# Patient Record
Sex: Female | Born: 1940
Health system: Southern US, Community
[De-identification: ages and names within clinical notes are randomized; demographics above are authoritative.]

## PROBLEM LIST (undated history)

## (undated) DIAGNOSIS — K644 Residual hemorrhoidal skin tags: Secondary | ICD-10-CM

## (undated) DIAGNOSIS — M8580 Other specified disorders of bone density and structure, unspecified site: Secondary | ICD-10-CM

## (undated) DIAGNOSIS — G250 Essential tremor: Secondary | ICD-10-CM

## (undated) DIAGNOSIS — H8390 Unspecified disease of inner ear, unspecified ear: Secondary | ICD-10-CM

## (undated) DIAGNOSIS — I872 Venous insufficiency (chronic) (peripheral): Secondary | ICD-10-CM

## (undated) DIAGNOSIS — R251 Tremor, unspecified: Secondary | ICD-10-CM

## (undated) DIAGNOSIS — E7800 Pure hypercholesterolemia, unspecified: Secondary | ICD-10-CM

## (undated) DIAGNOSIS — K648 Other hemorrhoids: Secondary | ICD-10-CM

## (undated) DIAGNOSIS — I7300 Raynaud's syndrome without gangrene: Secondary | ICD-10-CM

## (undated) HISTORY — PX: BREAST BIOPSY: SHX20

## (undated) HISTORY — DX: Tremor, unspecified: R25.1

## (undated) HISTORY — PX: TONSILLECTOMY: SUR1361

## (undated) HISTORY — DX: Residual hemorrhoidal skin tags: K64.4

## (undated) HISTORY — DX: Pure hypercholesterolemia, unspecified: E78.00

## (undated) HISTORY — PX: ABLATION SAPHENOUS VEIN W/ RFA: SUR11

## (undated) HISTORY — DX: Other hemorrhoids: K64.8

## (undated) HISTORY — DX: Essential tremor: G25.0

## (undated) HISTORY — DX: Other specified disorders of bone density and structure, unspecified site: M85.80

## (undated) HISTORY — PX: OTHER SURGICAL HISTORY: SHX169

---

## 1981-10-05 HISTORY — PX: OTHER SURGICAL HISTORY: SHX169

## 1989-10-05 HISTORY — PX: BREAST EXCISIONAL BIOPSY: SUR124

## 1998-08-01 ENCOUNTER — Ambulatory Visit (HOSPITAL_BASED_OUTPATIENT_CLINIC_OR_DEPARTMENT_OTHER): Admission: RE | Admit: 1998-08-01 | Discharge: 1998-08-01 | Payer: Self-pay

## 1999-05-22 ENCOUNTER — Encounter: Admission: RE | Admit: 1999-05-22 | Discharge: 1999-06-04 | Payer: Self-pay | Admitting: Orthopedic Surgery

## 2000-04-29 ENCOUNTER — Encounter: Payer: Self-pay | Admitting: Gynecology

## 2000-04-29 ENCOUNTER — Encounter: Admission: RE | Admit: 2000-04-29 | Discharge: 2000-04-29 | Payer: Self-pay | Admitting: Gynecology

## 2000-12-16 ENCOUNTER — Other Ambulatory Visit: Admission: RE | Admit: 2000-12-16 | Discharge: 2000-12-16 | Payer: Self-pay | Admitting: Gynecology

## 2001-10-19 ENCOUNTER — Ambulatory Visit (HOSPITAL_COMMUNITY): Admission: RE | Admit: 2001-10-19 | Discharge: 2001-10-19 | Payer: Self-pay | Admitting: Endocrinology

## 2001-10-19 ENCOUNTER — Encounter: Payer: Self-pay | Admitting: Endocrinology

## 2002-02-08 ENCOUNTER — Other Ambulatory Visit: Admission: RE | Admit: 2002-02-08 | Discharge: 2002-02-08 | Payer: Self-pay | Admitting: Gynecology

## 2003-03-07 ENCOUNTER — Other Ambulatory Visit: Admission: RE | Admit: 2003-03-07 | Discharge: 2003-03-07 | Payer: Self-pay | Admitting: Gynecology

## 2004-03-12 ENCOUNTER — Other Ambulatory Visit: Admission: RE | Admit: 2004-03-12 | Discharge: 2004-03-12 | Payer: Self-pay | Admitting: Gynecology

## 2004-03-17 ENCOUNTER — Encounter: Admission: RE | Admit: 2004-03-17 | Discharge: 2004-03-17 | Payer: Self-pay | Admitting: Gynecology

## 2006-02-16 ENCOUNTER — Encounter: Admission: RE | Admit: 2006-02-16 | Discharge: 2006-02-16 | Payer: Self-pay | Admitting: Gynecology

## 2006-02-23 ENCOUNTER — Other Ambulatory Visit: Admission: RE | Admit: 2006-02-23 | Discharge: 2006-02-23 | Payer: Self-pay | Admitting: Gynecology

## 2008-02-10 ENCOUNTER — Encounter: Payer: Self-pay | Admitting: Gastroenterology

## 2008-02-10 ENCOUNTER — Ambulatory Visit: Payer: Self-pay | Admitting: Gastroenterology

## 2008-02-10 DIAGNOSIS — E785 Hyperlipidemia, unspecified: Secondary | ICD-10-CM | POA: Insufficient documentation

## 2008-02-10 DIAGNOSIS — M129 Arthropathy, unspecified: Secondary | ICD-10-CM | POA: Insufficient documentation

## 2008-05-14 ENCOUNTER — Encounter: Admission: RE | Admit: 2008-05-14 | Discharge: 2008-05-14 | Payer: Self-pay | Admitting: Gynecology

## 2008-05-18 ENCOUNTER — Encounter: Admission: RE | Admit: 2008-05-18 | Discharge: 2008-05-18 | Payer: Self-pay | Admitting: Gynecology

## 2008-08-10 ENCOUNTER — Emergency Department (HOSPITAL_COMMUNITY): Admission: EM | Admit: 2008-08-10 | Discharge: 2008-08-10 | Payer: Self-pay | Admitting: Emergency Medicine

## 2011-06-05 ENCOUNTER — Encounter: Payer: Self-pay | Admitting: Gastroenterology

## 2012-03-08 DIAGNOSIS — I7300 Raynaud's syndrome without gangrene: Secondary | ICD-10-CM | POA: Diagnosis not present

## 2012-03-08 DIAGNOSIS — M519 Unspecified thoracic, thoracolumbar and lumbosacral intervertebral disc disorder: Secondary | ICD-10-CM | POA: Diagnosis not present

## 2012-03-08 DIAGNOSIS — G250 Essential tremor: Secondary | ICD-10-CM | POA: Diagnosis not present

## 2012-03-08 DIAGNOSIS — G252 Other specified forms of tremor: Secondary | ICD-10-CM | POA: Diagnosis not present

## 2012-03-08 DIAGNOSIS — E7800 Pure hypercholesterolemia, unspecified: Secondary | ICD-10-CM | POA: Diagnosis not present

## 2012-04-11 DIAGNOSIS — Z0000 Encounter for general adult medical examination without abnormal findings: Secondary | ICD-10-CM | POA: Diagnosis not present

## 2012-04-11 DIAGNOSIS — Z23 Encounter for immunization: Secondary | ICD-10-CM | POA: Diagnosis not present

## 2012-04-11 DIAGNOSIS — M949 Disorder of cartilage, unspecified: Secondary | ICD-10-CM | POA: Diagnosis not present

## 2012-04-11 DIAGNOSIS — M899 Disorder of bone, unspecified: Secondary | ICD-10-CM | POA: Diagnosis not present

## 2012-04-11 DIAGNOSIS — E7800 Pure hypercholesterolemia, unspecified: Secondary | ICD-10-CM | POA: Diagnosis not present

## 2012-04-11 DIAGNOSIS — Z131 Encounter for screening for diabetes mellitus: Secondary | ICD-10-CM | POA: Diagnosis not present

## 2012-04-25 DIAGNOSIS — Z1231 Encounter for screening mammogram for malignant neoplasm of breast: Secondary | ICD-10-CM | POA: Diagnosis not present

## 2012-04-27 DIAGNOSIS — E7800 Pure hypercholesterolemia, unspecified: Secondary | ICD-10-CM | POA: Diagnosis not present

## 2012-04-27 DIAGNOSIS — Z01419 Encounter for gynecological examination (general) (routine) without abnormal findings: Secondary | ICD-10-CM | POA: Diagnosis not present

## 2012-04-27 DIAGNOSIS — M949 Disorder of cartilage, unspecified: Secondary | ICD-10-CM | POA: Diagnosis not present

## 2012-04-27 DIAGNOSIS — R350 Frequency of micturition: Secondary | ICD-10-CM | POA: Diagnosis not present

## 2012-04-27 DIAGNOSIS — M899 Disorder of bone, unspecified: Secondary | ICD-10-CM | POA: Diagnosis not present

## 2012-08-22 DIAGNOSIS — H43819 Vitreous degeneration, unspecified eye: Secondary | ICD-10-CM | POA: Diagnosis not present

## 2013-06-07 DIAGNOSIS — G250 Essential tremor: Secondary | ICD-10-CM | POA: Diagnosis not present

## 2013-06-07 DIAGNOSIS — R259 Unspecified abnormal involuntary movements: Secondary | ICD-10-CM | POA: Diagnosis not present

## 2013-06-07 DIAGNOSIS — E7800 Pure hypercholesterolemia, unspecified: Secondary | ICD-10-CM | POA: Diagnosis not present

## 2013-06-07 DIAGNOSIS — Z0000 Encounter for general adult medical examination without abnormal findings: Secondary | ICD-10-CM | POA: Diagnosis not present

## 2013-06-07 DIAGNOSIS — Z1331 Encounter for screening for depression: Secondary | ICD-10-CM | POA: Diagnosis not present

## 2013-06-07 DIAGNOSIS — M899 Disorder of bone, unspecified: Secondary | ICD-10-CM | POA: Diagnosis not present

## 2013-06-07 DIAGNOSIS — R42 Dizziness and giddiness: Secondary | ICD-10-CM | POA: Diagnosis not present

## 2013-10-20 DIAGNOSIS — R05 Cough: Secondary | ICD-10-CM | POA: Diagnosis not present

## 2013-10-20 DIAGNOSIS — R059 Cough, unspecified: Secondary | ICD-10-CM | POA: Diagnosis not present

## 2013-10-25 DIAGNOSIS — H6980 Other specified disorders of Eustachian tube, unspecified ear: Secondary | ICD-10-CM | POA: Diagnosis not present

## 2013-10-25 DIAGNOSIS — H6690 Otitis media, unspecified, unspecified ear: Secondary | ICD-10-CM | POA: Diagnosis not present

## 2013-11-03 DIAGNOSIS — H6120 Impacted cerumen, unspecified ear: Secondary | ICD-10-CM | POA: Diagnosis not present

## 2013-11-03 DIAGNOSIS — H6980 Other specified disorders of Eustachian tube, unspecified ear: Secondary | ICD-10-CM | POA: Diagnosis not present

## 2013-11-07 DIAGNOSIS — H9209 Otalgia, unspecified ear: Secondary | ICD-10-CM | POA: Diagnosis not present

## 2013-11-07 DIAGNOSIS — R070 Pain in throat: Secondary | ICD-10-CM | POA: Diagnosis not present

## 2013-12-04 DIAGNOSIS — J069 Acute upper respiratory infection, unspecified: Secondary | ICD-10-CM | POA: Diagnosis not present

## 2013-12-04 DIAGNOSIS — J029 Acute pharyngitis, unspecified: Secondary | ICD-10-CM | POA: Diagnosis not present

## 2013-12-12 DIAGNOSIS — J40 Bronchitis, not specified as acute or chronic: Secondary | ICD-10-CM | POA: Diagnosis not present

## 2013-12-29 DIAGNOSIS — M25476 Effusion, unspecified foot: Secondary | ICD-10-CM | POA: Diagnosis not present

## 2013-12-29 DIAGNOSIS — M25473 Effusion, unspecified ankle: Secondary | ICD-10-CM | POA: Diagnosis not present

## 2013-12-29 DIAGNOSIS — R05 Cough: Secondary | ICD-10-CM | POA: Diagnosis not present

## 2013-12-29 DIAGNOSIS — R059 Cough, unspecified: Secondary | ICD-10-CM | POA: Diagnosis not present

## 2014-03-19 DIAGNOSIS — L259 Unspecified contact dermatitis, unspecified cause: Secondary | ICD-10-CM | POA: Diagnosis not present

## 2014-03-25 DIAGNOSIS — L255 Unspecified contact dermatitis due to plants, except food: Secondary | ICD-10-CM | POA: Diagnosis not present

## 2014-03-25 DIAGNOSIS — T6391XA Toxic effect of contact with unspecified venomous animal, accidental (unintentional), initial encounter: Secondary | ICD-10-CM | POA: Diagnosis not present

## 2014-03-26 DIAGNOSIS — L259 Unspecified contact dermatitis, unspecified cause: Secondary | ICD-10-CM | POA: Diagnosis not present

## 2014-09-12 DIAGNOSIS — B0089 Other herpesviral infection: Secondary | ICD-10-CM | POA: Diagnosis not present

## 2014-11-14 DIAGNOSIS — Z0000 Encounter for general adult medical examination without abnormal findings: Secondary | ICD-10-CM | POA: Diagnosis not present

## 2014-11-14 DIAGNOSIS — E780 Pure hypercholesterolemia: Secondary | ICD-10-CM | POA: Diagnosis not present

## 2014-11-14 DIAGNOSIS — R079 Chest pain, unspecified: Secondary | ICD-10-CM | POA: Diagnosis not present

## 2014-11-14 DIAGNOSIS — Z23 Encounter for immunization: Secondary | ICD-10-CM | POA: Diagnosis not present

## 2014-11-14 DIAGNOSIS — Z1211 Encounter for screening for malignant neoplasm of colon: Secondary | ICD-10-CM | POA: Diagnosis not present

## 2014-11-14 DIAGNOSIS — M8580 Other specified disorders of bone density and structure, unspecified site: Secondary | ICD-10-CM | POA: Diagnosis not present

## 2014-12-10 ENCOUNTER — Encounter: Payer: Self-pay | Admitting: *Deleted

## 2014-12-12 ENCOUNTER — Ambulatory Visit: Payer: Self-pay | Admitting: Cardiology

## 2015-01-03 DIAGNOSIS — M8580 Other specified disorders of bone density and structure, unspecified site: Secondary | ICD-10-CM | POA: Diagnosis not present

## 2015-01-03 DIAGNOSIS — M899 Disorder of bone, unspecified: Secondary | ICD-10-CM | POA: Diagnosis not present

## 2015-01-21 ENCOUNTER — Encounter: Payer: Self-pay | Admitting: *Deleted

## 2015-01-23 ENCOUNTER — Ambulatory Visit (INDEPENDENT_AMBULATORY_CARE_PROVIDER_SITE_OTHER): Payer: Medicare Other | Admitting: Cardiology

## 2015-01-23 ENCOUNTER — Encounter: Payer: Self-pay | Admitting: Cardiology

## 2015-01-23 VITALS — BP 110/66 | HR 59 | Ht 65.0 in | Wt 165.1 lb

## 2015-01-23 DIAGNOSIS — R079 Chest pain, unspecified: Secondary | ICD-10-CM | POA: Diagnosis not present

## 2015-01-23 DIAGNOSIS — R0600 Dyspnea, unspecified: Secondary | ICD-10-CM

## 2015-01-23 DIAGNOSIS — R0609 Other forms of dyspnea: Secondary | ICD-10-CM

## 2015-01-23 DIAGNOSIS — M129 Arthropathy, unspecified: Secondary | ICD-10-CM

## 2015-01-23 NOTE — Progress Notes (Signed)
Cardiology Office Note   Date:  01/23/2015   ID:  ABERDEEN HAFEN, DOB 13-Mar-1941, MRN [id number]  PCP:  Gerrit Heck, MD  Cardiologist:   Darlin Coco, MD   No chief complaint on file.     History of Present Illness: Crystal Thomas is a 74 y.o. female who presents for evaluation of exertional dyspnea and chest discomfort.  She is seen at the request of Dr. Leighton Ruff.  She does not have any prior history of known heart problems.  The past several months she has been experiencing some intermittent episodes of exertional dyspnea.  She had not been getting much regular exercise because of family health issues with other family members.  She did finally get back to the gym she found that she was having difficulty using the elliptical machine.  She would use it for just a few minutes and be totally exhausted and dyspneic.  She was able to do the treadmill.  Also been experiencing some occasional chest tightness which does not necessarily correlate with the episodes of exertional dyspnea.  The chest discomfort is substernal but does not radiate to the arm or neck or back.  The patient has not had a previous ischemic workup.  She does have a history of marked hypercholesterolemia.  Years ago she was placed on Lipitor and found that it seemed to be causing mental lassitude and confusion and so she stopped it.  Since then she has not been known to take any cholesterol medication.  Most recent cholesterol was 280 with LDL 199.  Patient does not have any history of high blood pressure or diabetes.  She does not have any history of premature coronary disease in the family.  Her father died at age 42 in [place].  Her mother died at age 34 of old age. The patient has been on propranolol 20 mg twice a day for about 30 years.  She was placed on it by Dr. Electa Sniff for treatment of a head bobbing tremor.  It has been effective. The patient has been generally healthy all of her  life.  Previous surgery includes a hysterectomy.  The patient had 4 children.  One child died of leukemia as an infant.  She has 3 grandchildren.  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  Does not smoke or drink    Past Medical History  Diagnosis Date  . Hypercholesteremia   . Osteopenia     Past Surgical History  Procedure Laterality Date  . Hysterectomey    . Breast biopsy       Current Outpatient Prescriptions  Medication Sig Dispense Refill  . aspirin 81 MG tablet Take 81 mg by mouth daily.    . propranolol (INDERAL) 20 MG tablet Take 20 mg by mouth 3 (three) times daily. Take 1-2 tablets in the morning and one at night.    . valACYclovir (VALTREX) 1000 MG tablet Take 1,000 mg by mouth as needed.     No current facility-administered medications for this visit.    Allergies:   Lipitor and Sulfonamide derivatives    Social History:  The patient  reports that she has never smoked. She does not have any smokeless tobacco history on file. She reports that she does not drink alcohol or use illicit drugs.   Family History:  The patient's family history includes Atrial fibrillation in her mother; Heart failure in her mother; Hypertension in her brother.    ROS:  Please see the history of present illness.   Otherwise, review of systems are positive for none.   All other systems are reviewed and negative.    PHYSICAL EXAM: VS:  BP 110/66 mmHg  Pulse 59  Ht 5\' 5"  (1.651 m)  Wt 165 lb 1.9 oz (74.898 kg)  BMI 27.48 kg/m2 , BMI Body mass index is 27.48 kg/(m^2). GEN: Well nourished, well developed, in no acute distress HEENT: normal Neck: no JVD, carotid bruits, or masses Cardiac: RRR; no murmurs, rubs, or gallops,no edema  Respiratory:  clear to auscultation bilaterally, normal work of breathing GI: soft, nontender, nondistended, + BS MS: no deformity or atrophy Skin: warm and dry, no rash Neuro:  Strength and sensation are intact Psych: euthymic mood, full  affect   EKG:  EKG is ordered today. The ekg ordered today demonstrates sinus bradycardia.  Nonspecific ST abnormality.  No ischemic changes.   Recent Labs: No results found for requested labs within last 365 days.    Lipid Panel No results found for: CHOL, TRIG, HDL, CHOLHDL, VLDL, LDLCALC, LDLDIRECT    Wt Readings from Last 3 Encounters:  01/23/15 165 lb 1.9 oz (74.898 kg)  02/10/08 164 lb (74.39 kg)      Other studies Reviewed: Additional studies/ records that were reviewed today include: Laboratory studies from [facility] dated 11/15/14 Review of the above records demonstrates: Cholesterol 280 triglycerides 70 HDL 67 calculated LDL 199 non-HDL 213 blood sugar 89, creatinine 0.74, GFR estimated at 77   ASSESSMENT AND PLAN:  1.  Exertional dyspnea and chest discomfort uncertain etiology with moderate suspicion for coronary artery disease. 2.  Hypercholesterolemia 3.  Chronic head bobbing tremor with propranolol  Disposition: We will obtain a chest x-ray. We will have the patient return for a treadmill Myoview stress test.  She will hold Inderal that she takes for her head bobbing tremor for 24 hours prior to the test. Also suggested that she take a baby aspirin daily.     Labs/ tests ordered today include:   Orders Placed This Encounter  Procedures  . DG Chest 2 View  . Myocardial Perfusion Imaging  . EKG 12-Lead    Many thanks for the opportunity to see this pleasant woman with you.  We will be in touch regarding the results of her treadmill Myoview stress test   Signed, Darlin Coco, MD  01/23/2015 4:42 PM    [facility] [place], [place], [place]  [address] Phone: [phone]; Fax: [phone]

## 2015-01-23 NOTE — Patient Instructions (Signed)
Medication Instructions:  START ASPIRIN 81 MG DAILY   Labwork: NONE  Testing/Procedures: A chest x-ray takes a picture of the organs and structures inside the chest, including the heart, lungs, and blood vessels. This test can show several things, including, whether the heart is enlarges; whether fluid is building up in the lungs; and whether pacemaker / defibrillator leads are still in place.  [facility] BETWEEN 9 AM AND 4 PM MON-FRI  Your physician has requested that you have en exercise stress myoview. For further information please visit HugeFiesta.tn. Please follow instruction sheet, as given.  Follow-Up: AS NEEDED

## 2015-02-13 ENCOUNTER — Ambulatory Visit (HOSPITAL_COMMUNITY): Payer: Medicare Other | Attending: Cardiology

## 2015-02-13 ENCOUNTER — Other Ambulatory Visit (HOSPITAL_COMMUNITY): Payer: Medicare Other

## 2015-02-13 DIAGNOSIS — R0600 Dyspnea, unspecified: Secondary | ICD-10-CM | POA: Diagnosis not present

## 2015-02-13 DIAGNOSIS — R079 Chest pain, unspecified: Secondary | ICD-10-CM | POA: Insufficient documentation

## 2015-02-13 DIAGNOSIS — R0609 Other forms of dyspnea: Secondary | ICD-10-CM | POA: Diagnosis not present

## 2015-02-13 LAB — MYOCARDIAL PERFUSION IMAGING
CHL CUP NUCLEAR SSS: 4
CHL CUP STRESS STAGE 1 DBP: 74 mmHg
CHL CUP STRESS STAGE 1 GRADE: 0 %
CHL CUP STRESS STAGE 1 SBP: 124 mmHg
CHL CUP STRESS STAGE 1 SPEED: 0 mph
CHL CUP STRESS STAGE 2 DBP: 73 mmHg
CHL CUP STRESS STAGE 2 SBP: 110 mmHg
CHL CUP STRESS STAGE 4 HR: 97 {beats}/min
CHL CUP STRESS STAGE 4 SBP: 118 mmHg
CHL CUP STRESS STAGE 4 SPEED: 1.7 mph
CHL CUP STRESS STAGE 5 HR: 137 {beats}/min
CHL CUP STRESS STAGE 5 SBP: 154 mmHg
CHL CUP STRESS STAGE 7 DBP: 66 mmHg
CHL CUP STRESS STAGE 7 GRADE: 0 %
CHL CUP STRESS STAGE 7 HR: 116 {beats}/min
CHL CUP STRESS STAGE 7 SPEED: 0 mph
CHL CUP STRESS STAGE 8 DBP: 68 mmHg
CHL CUP STRESS STAGE 8 GRADE: 0 %
CHL CUP STRESS STAGE 8 HR: 75 {beats}/min
CSEPED: 6 min
CSEPEDS: 0 s
Estimated workload: 7 METS
LHR: 0.37
LVDIAVOL: 60 mL
LVSYSVOL: 16 mL
MPHR: 146 {beats}/min
NUC STRESS EF: 74 %
NUC STRESS TID: 0.79
Peak HR: 137 {beats}/min
Percent HR: 93 %
Percent of predicted max HR: 93 %
Rest HR: 56 {beats}/min
SDS: 1
SRS: 3
Stage 1 HR: 67 {beats}/min
Stage 2 Grade: 0 %
Stage 2 HR: 57 {beats}/min
Stage 2 Speed: 0 mph
Stage 3 Grade: 0 %
Stage 3 HR: 57 {beats}/min
Stage 3 Speed: 0 mph
Stage 4 DBP: 69 mmHg
Stage 4 Grade: 10 %
Stage 5 DBP: 72 mmHg
Stage 5 Grade: 12 %
Stage 5 Speed: 2.5 mph
Stage 6 Grade: 12 %
Stage 6 HR: 137 {beats}/min
Stage 6 Speed: 2.5 mph
Stage 7 SBP: 149 mmHg
Stage 8 SBP: 148 mmHg
Stage 8 Speed: 0 mph

## 2015-02-13 MED ORDER — TECHNETIUM TC 99M SESTAMIBI GENERIC - CARDIOLITE
11.0000 | Freq: Once | INTRAVENOUS | Status: AC | PRN
  Administered 2015-02-13: 11 via INTRAVENOUS

## 2015-02-13 MED ORDER — TECHNETIUM TC 99M SESTAMIBI GENERIC - CARDIOLITE
33.0000 | Freq: Once | INTRAVENOUS | Status: AC | PRN
Start: 2015-02-13 — End: 2015-02-13
  Administered 2015-02-13: 33 via INTRAVENOUS

## 2015-02-14 ENCOUNTER — Telehealth: Payer: Self-pay | Admitting: Cardiology

## 2015-02-14 DIAGNOSIS — R0600 Dyspnea, unspecified: Secondary | ICD-10-CM

## 2015-02-14 DIAGNOSIS — R0609 Other forms of dyspnea: Secondary | ICD-10-CM

## 2015-02-14 DIAGNOSIS — R0602 Shortness of breath: Secondary | ICD-10-CM

## 2015-02-14 NOTE — Telephone Encounter (Signed)
New message     Returning Melinda's call to get test results

## 2015-02-14 NOTE — Telephone Encounter (Signed)
Advised patient of results  Patient states that she did short of breath while walking on treadmill Does not get short of breath all the time but when does it is with exertion and to the point she needs to stop what she is doing  Discussed with  Dr. Mare Ferrari and will have patient return for Echo Advised patient, verbalized understanding

## 2015-02-14 NOTE — Telephone Encounter (Signed)
-----   Message from Darlin Coco, MD sent at 02/13/2015  3:38 PM EDT ----- Please report.  The Lexiscan Myoview stress test was normal.  No ischemia.  Normal left ventricular function.  Please send a copy to Dr. Drema Dallas

## 2015-02-22 ENCOUNTER — Other Ambulatory Visit: Payer: Self-pay

## 2015-02-22 ENCOUNTER — Ambulatory Visit (HOSPITAL_COMMUNITY): Payer: Medicare Other | Attending: Cardiology

## 2015-02-22 DIAGNOSIS — M1990 Unspecified osteoarthritis, unspecified site: Secondary | ICD-10-CM | POA: Insufficient documentation

## 2015-02-22 DIAGNOSIS — R0609 Other forms of dyspnea: Secondary | ICD-10-CM

## 2015-02-22 DIAGNOSIS — R0600 Dyspnea, unspecified: Secondary | ICD-10-CM

## 2015-02-22 DIAGNOSIS — E785 Hyperlipidemia, unspecified: Secondary | ICD-10-CM | POA: Insufficient documentation

## 2015-04-11 DIAGNOSIS — N952 Postmenopausal atrophic vaginitis: Secondary | ICD-10-CM | POA: Diagnosis not present

## 2015-04-11 DIAGNOSIS — N760 Acute vaginitis: Secondary | ICD-10-CM | POA: Diagnosis not present

## 2015-04-11 DIAGNOSIS — Z1239 Encounter for other screening for malignant neoplasm of breast: Secondary | ICD-10-CM | POA: Diagnosis not present

## 2015-05-24 DIAGNOSIS — M7989 Other specified soft tissue disorders: Secondary | ICD-10-CM | POA: Diagnosis not present

## 2015-05-24 DIAGNOSIS — N952 Postmenopausal atrophic vaginitis: Secondary | ICD-10-CM | POA: Diagnosis not present

## 2015-05-24 DIAGNOSIS — N760 Acute vaginitis: Secondary | ICD-10-CM | POA: Diagnosis not present

## 2015-05-24 DIAGNOSIS — N898 Other specified noninflammatory disorders of vagina: Secondary | ICD-10-CM | POA: Diagnosis not present

## 2015-07-24 DIAGNOSIS — N952 Postmenopausal atrophic vaginitis: Secondary | ICD-10-CM | POA: Diagnosis not present

## 2015-07-24 DIAGNOSIS — R32 Unspecified urinary incontinence: Secondary | ICD-10-CM | POA: Diagnosis not present

## 2015-07-24 DIAGNOSIS — N760 Acute vaginitis: Secondary | ICD-10-CM | POA: Diagnosis not present

## 2015-08-14 ENCOUNTER — Ambulatory Visit: Payer: Medicare Other | Admitting: Physical Therapy

## 2015-09-04 ENCOUNTER — Ambulatory Visit: Payer: Medicare Other | Attending: Obstetrics & Gynecology | Admitting: Physical Therapy

## 2015-09-04 ENCOUNTER — Encounter: Payer: Self-pay | Admitting: Physical Therapy

## 2015-09-04 DIAGNOSIS — N8189 Other female genital prolapse: Secondary | ICD-10-CM

## 2015-09-04 DIAGNOSIS — N907 Vulvar cyst: Secondary | ICD-10-CM | POA: Diagnosis not present

## 2015-09-04 NOTE — Patient Instructions (Signed)
Quick Contraction: Gravity Resisted (Sitting)    Sitting, quickly squeeze then fully relax pelvic floor. Perform __1_ sets of _5__. Rest for _1__ seconds between sets. Do _3__ times a day.  Copyright  VHI. All rights reserved.  Slow Contraction: Gravity Resisted (Sitting)    Sitting, slowly squeeze pelvic floor for _6__ seconds. Rest for _3__ seconds. Repeat _10__ times. Do _3__ times a day.  Copyright  VHI. All rights reserved.   Certain foods and liquids will decrease the pH making the urine more acidic.  Urinary urgency increases when the urine has a low pH.  Most common irritants: alcohol, carbonated beverages and caffinated beverages.  Foods to avoid: apple juice, apples, ascorbic acid, canteloupes, chili, citrus fruits, coffee, cranberries, grapes, guava, peaches, pepper, pineapple, plums, strawberries, tea, tomatoes, and vinegar.  Drinking plenty of water may help to increase the pH and dilute out any of the effects of specific irritants.  Foods that are NOT irritating to the bladder include: Pears, papayas, sun-brewed teas, watermelons, non-citrus herbal teas, apricots, kava and low-acid instant drinks (Postum)  [facility] [address] [place],  [address] Phone # [phone] Fax [phone]

## 2015-09-04 NOTE — Therapy (Signed)
[facility] [address], [place], [place], [address] Phone: [phone]   Fax:  [phone]  Physical Therapy Evaluation  Patient Details  Name: Crystal Thomas MRN: [id number] Date of Birth: [birth year] Referring Provider: Dr. Janyth Pupa  Encounter Date: 09/04/2015      PT End of Session - 09/04/15 1224    Visit Number 1   Number of Visits 10  Medicare   Date for PT Re-Evaluation 10/30/15   PT Start Time [id number]   PT Stop Time 1225   PT Time Calculation (min) 40 min   Activity Tolerance Patient tolerated treatment well   Behavior During Therapy [facility] for tasks assessed/performed      Past Medical History  Diagnosis Date  . Hypercholesteremia   . Osteopenia     Past Surgical History  Procedure Laterality Date  . Hysterectomey    . Breast biopsy      There were no vitals filed for this visit.  Visit Diagnosis:  Perineal floor weakness - Plan: PT plan of care cert/re-cert      Subjective Assessment - 09/04/15 1151    Subjective Patient reports a vaginal discharge and had it checked out. Patient has odor after discharge was cleared up and realized odor was urine. Trace of leakage on pantyliner.    Patient Stated Goals reduce leakage   Currently in Pain? No/denies            [facility] PT Assessment - 09/04/15 0001    Assessment   Medical Diagnosis R32 Urinary incontinence   Referring Provider Dr. Janyth Pupa   Onset Date/Surgical Date 06/06/15   Prior Therapy None   Precautions   Precautions None   Balance Screen   Has the patient fallen in the past 6 months No   Has the patient had a decrease in activity level because of a fear of falling?  No   Is the patient reluctant to leave their home because of a fear of falling?  No   Prior Function   Level of Independence Independent   Leisure takes care of husband   Cognition   Overall Cognitive Status Within Functional Limits for tasks  assessed   Observation/Other Assessments   Focus on Therapeutic Outcomes (FOTO)  43% limitaiton CK  goals is 30% limitation CK   ROM / Strength   AROM / PROM / Strength AROM;Strength   AROM   Lumbar Extension decreased by 50%   Lumbar - Right Side Bend decreased by 255   Lumbar - Left Side Bend decreased by 25%   Strength   Right Hip Extension 4/5   Right Hip ABduction 3+/5   Left Hip Extension 4/5   Left Hip ABduction 4/5                 Pelvic Floor Special Questions - 09/04/15 0001    Are you Pregnant or attempting pregnancy? No   Prior Pregnancies Yes   Number of Pregnancies 4   Number of Vaginal Deliveries 4   Episiotomy Performed Yes   Currently Sexually Active No   Urinary Leakage Yes   Pad use 2  light day   Activities that cause leaking With strong urge   Urinary urgency Yes   Fecal incontinence No   Caffeine beverages 3   Falling out feeling (prolapse) No   Pelvic Floor Internal Exam Patient confirms identification and approves physical therapist to assess muscle strength   Exam Type Vaginal   Strength  fair squeeze, definite lift   Strength # of seconds 6                  PT Education - 09-11-2015 1224    Education provided Yes   Education Details bladder irritants, pelvic floor exercises   Person(s) Educated Patient   Methods Explanation;Demonstration;Verbal cues;Handout   Comprehension Returned demonstration;Verbalized understanding          PT Short Term Goals - Sep 11, 2015 1317    PT SHORT TERM GOAL #1   Title understand what bladder irritants are and how they affect the bladder   Time 4   Period Weeks   Status New   PT SHORT TERM GOAL #2   Title independent with pelvic floor exercise HEP   Time 4   Period Weeks   Status New   PT SHORT TERM GOAL #3   Title independent with flexibility exercises   Time 4   Period Weeks   Status New   PT SHORT TERM GOAL #4   Title urinary leakage decresaed >/= 25%   Time 4   Period Weeks    Status New           PT Long Term Goals - 11-Sep-2015 1319    PT LONG TERM GOAL #1   Title independent with curent HEP and how to progress herself   Time 8   Period Weeks   Status New   PT LONG TERM GOAL #2   Title urinary leakage decreased >/= 75%   Time 8   Period Weeks   Status New   PT LONG TERM GOAL #3   Title does not have to wear a pad due to improved urinary leakage   Time 8   Period Weeks   Status New               Plan - September 11, 2015 1225    Clinical Impression Statement Patient is a 74 year old female with diagnosis of urinary incontinence since 06/2015.  Patient reports no pain.  FOTO score is 43% limitation.  Patient reports urinary leakage when she has a strong urge and wears 2 pads per day. Lumbar ROM deficits: extension decreased by 505 and bil. sidebending decreased by 25%. Hip strenth deficits right/left/5: abduction 3+/4/5 and extension 4/4/5. Pelvic floor strength is 3/ with circular contraction and slight lift. Patient will benefit from physical therapy to improve pelvic floor strength and reduce urinary incontinence.    Pt will benefit from skilled therapeutic intervention in order to improve on the following deficits Decreased activity tolerance;Decreased strength;Decreased endurance   Rehab Potential Excellent   Clinical Impairments Affecting Rehab Potential None   PT Frequency 1x / week   PT Duration 8 weeks   PT Treatment/Interventions ADLs/Self Care Home Management;Therapeutic exercise;Therapeutic activities;Neuromuscular re-education;Patient/family education   PT Next Visit Plan hip stretches for SKC, hip rotators, hamstring, hip strength for abduction and extension, and pelvic floor exercises   PT Home Exercise Plan hip flexibility and strength   Recommended Other Services None   Consulted and Agree with Plan of Care Patient          G-Codes - 2015-09-11 1145    Functional Assessment Tool Used FOTO score is 43% limitation CK  goal is 30%  limitation CJ   Functional Limitation Other PT primary   Other PT Primary Current Status [id number]) At least 40 percent but less than 60 percent impaired, limited or restricted   Other PT Primary Goal Status [id number]) At  least 20 percent but less than 40 percent impaired, limited or restricted       Problem List Patient Active Problem List   Diagnosis Date Noted  . Dyspnea on exertion 01/23/2015  . Chest pain with moderate risk for cardiac etiology 01/23/2015  . HYPERLIPIDEMIA 02/10/2008  . ARTHRITIS 02/10/2008    Crystal Thomas,PT 09/04/2015, 1:21 PM  [facility] [address] [place], [place], [address] Phone: [phone]   Fax:  [phone]  Name: Crystal Thomas MRN: [id number] Date of Birth: 01-27-1941

## 2015-09-25 ENCOUNTER — Encounter: Payer: Self-pay | Admitting: Physical Therapy

## 2015-09-25 ENCOUNTER — Ambulatory Visit: Payer: Medicare Other | Attending: Obstetrics & Gynecology | Admitting: Physical Therapy

## 2015-09-25 DIAGNOSIS — N907 Vulvar cyst: Secondary | ICD-10-CM | POA: Diagnosis not present

## 2015-09-25 DIAGNOSIS — N8189 Other female genital prolapse: Secondary | ICD-10-CM

## 2015-09-25 NOTE — Patient Instructions (Signed)
Chair Sitting    Sit at edge of seat, spine straight, one leg extended.Keep toe pointed to head.  Put a hand on each thigh and bend forward from the hip, keeping spine straight. Allow hand on extended leg to reach toward toes. Support upper body with other arm. Hold _30__ seconds. Repeat _2__ times per session. Do _1__ sessions per day.  Copyright  VHI. All rights reserved.  Piriformis Stretch, Sitting    Sit, one ankle on opposite knee, same-side hand on crossed knee. Push down on knee, keeping spine straight. Lean torso forward, with flat back, until tension is felt in hamstrings and gluteals of crossed-leg side. Hold _30__ seconds.  Repeat _2__ times per session. Do _1__ sessions per day.  Copyright  VHI. All rights reserved.  ABDUCTION: Standing (Active)    Stand, feet flat. Lift right leg out to side.  Complete __1_ sets of _10__ repetitions. Perform ___ sessions per day. When you can do 30x on each leg without and pain then add 1 pound http://gtsc.exer.us/110   Copyright  VHI. All rights reserved.  EXTENSION: Standing (Active)    Stand, both feet flat. Draw right leg behind body as far as possible. . Complete __1_ sets of _10__ repetitions. Perform __1_ sessions per day. When you can do 30 x without pain or difficulty add 1#aortic stenosis  http://gtsc.exer.us/76   Copyright  VHI. All rights reserved.  Knee Extension: Resisted (Sitting)    With band looped around right ankle and under other foot, straighten leg with ankle loop.Pull up pelvic floor as you straighten the leg and breath out. As Keep other leg bent to increase resistance. Repeat _30___ times per set. Do _1___ sets per session. Do _1___ sessions per day.  http://orth.exer.us/691   Copyright  VHI. All rights reserved.  Squat    Standing, squeeze pelvic floor and hold. Squat. Hold squat for 10 sec. Bend at hips. Relax. Repeat _10__ times. Do _1__ times a day.  Copyright  VHI. All rights  reserved.  Abdominal Bracing With Pelvic Floor (Hook-Lying)    With neutral spine, tighten pelvic floor as you breath out and abdominals. Hold 5 sec. Repeat _10__ times. Do _1__ times a day.   Copyright  VHI. All rights reserved.    Bracing With Knee Fallout (Hook-Lying)    With neutral spine, breath in as you  legs, drop knee out to side. Then bring knee in as you pull up the pelvic floor and breath out. Keep opposite hip still. Repeat _10__ times. Do _1__ times a day. Each leg.   Copyright  VHI. All rights reserved.  [facility] [address], [place], [place] [address] Phone # [phone] Fax [phone]

## 2015-09-25 NOTE — Therapy (Addendum)
[facility] [address] [place], [place], [address] Phone: [phone]   Fax:  [phone]  Physical Therapy Treatment  Patient Details  Name: Crystal Thomas MRN: [id number] Date of Birth: 18-Nov-1940 Referring Provider: Dr. Janyth Pupa  Encounter Date: 09/25/2015      PT End of Session - 09/25/15 1151    Visit Number 2   Number of Visits 10  medicare   Date for PT Re-Evaluation 10/30/15   PT Start Time 1100   PT Stop Time 1140   PT Time Calculation (min) 40 min   Activity Tolerance Patient tolerated treatment well   Behavior During Therapy [facility] for tasks assessed/performed      Past Medical History  Diagnosis Date  . Hypercholesteremia   . Osteopenia     Past Surgical History  Procedure Laterality Date  . Hysterectomey    . Breast biopsy      There were no vitals filed for this visit.  Visit Diagnosis:  Perineal floor weakness      Subjective Assessment - 09/25/15 1110    Subjective Patient reports her urinary leakage is 205 better. I have had a little sciatica the past week. I feel like I have tight hips.    Patient Stated Goals reduce leakage   Currently in Pain? Yes   Pain Score 4    Pain Location Back   Pain Orientation Right   Pain Descriptors / Indicators Tingling;Aching   Pain Type Acute pain   Pain Radiating Towards down right leg to right knee   Pain Onset In the past 7 days   Pain Frequency Intermittent   Aggravating Factors  twisting; sweeping, rake the yard   Pain Relieving Factors rest   Multiple Pain Sites No     G-code: Functional assessment tool used FOTO score is 43% Limitation ; functional limitation other PT primary, goal status is CJ, discharge status is CK.  Patient did not return. Earlie Counts, PT 12/04/2015 3:43 PM                      [facility] Adult PT Treatment/Exercise - 09/25/15 0001    Lumbar Exercises: Stretches   Active Hamstring  Stretch 30 seconds;2 reps  bil. sitting   Piriformis Stretch 2 reps;30 seconds  bil. sitting   Lumbar Exercises: Supine   Ab Set 10 reps;5 seconds   AB Set Limitations with pelvic floor contraction   Clam 10 reps  bil. with pelvic floor contraction   Knee/Hip Exercises: Standing   Hip Abduction Left;Right;10 reps  with control and no tipping of trunk   Hip Extension Left;Right;10 reps  vc to go slow and keep trunk still   Other Standing Knee Exercises squat with holding on counter hold 10 sec 5 with pelvic floor contraction   Other Standing Knee Exercises sitting knee extension with red band 30x bil.                 PT Education - 09/25/15 1147    Education provided Yes   Education Details pelvic floor strengthening, flexibility exercises, hip and knee strengthening   Person(s) Educated Patient   Methods Explanation;Demonstration;Verbal cues;Handout   Comprehension Returned demonstration;Verbalized understanding          PT Short Term Goals - 09/25/15 1152    PT SHORT TERM GOAL #1   Title understand what bladder irritants are and how they affect the bladder   Time 4   Period  Weeks   Status Achieved   PT SHORT TERM GOAL #2   Title independent with pelvic floor exercise HEP   Time 4   Period Weeks   Status Achieved   PT SHORT TERM GOAL #3   Title independent with flexibility exercises   Time 4   Period Weeks   Status On-going   PT SHORT TERM GOAL #4   Title urinary leakage decresaed >/= 25%   Time 4   Period Weeks   Status On-going  20% better           PT Long Term Goals - 09/04/15 1319    PT LONG TERM GOAL #1   Title independent with curent HEP and how to progress herself   Time 8   Period Weeks   Status New   PT LONG TERM GOAL #2   Title urinary leakage decreased >/= 75%   Time 8   Period Weeks   Status New   PT LONG TERM GOAL #3   Title does not have to wear a pad due to improved urinary leakage   Time 8   Period Weeks   Status New                Plan - 09/25/15 1152    Clinical Impression Statement Patient is a 74 year old female with diagnosis of urinary incontinenece.  Patient reports her urinary incontinence has improved by 20%.  Patient reports she has slipped her disc and causes sciatica down right leg.  Patient has weakness in legs and core.  Patient will benefit from physical therapy to increase knee, hip and pelvic floor strength.    Pt will benefit from skilled therapeutic intervention in order to improve on the following deficits Decreased activity tolerance;Decreased strength;Decreased endurance   Rehab Potential Excellent   Clinical Impairments Affecting Rehab Potential None   PT Frequency 1x / week   PT Duration 8 weeks   PT Treatment/Interventions ADLs/Self Care Home Management;Therapeutic exercise;Therapeutic activities;Neuromuscular re-education;Patient/family education   PT Next Visit Plan  pelvic floor exercises holding for longer time   PT Home Exercise Plan progress pelvic floor exercises   Consulted and Agree with Plan of Care Patient        Problem List Patient Active Problem List   Diagnosis Date Noted  . Dyspnea on exertion 01/23/2015  . Chest pain with moderate risk for cardiac etiology 01/23/2015  . HYPERLIPIDEMIA 02/10/2008  . ARTHRITIS 02/10/2008    Earlie Counts, PT 09/25/2015 11:57 AM   [facility] [address], [place], [place], [address] Phone: [phone]   Fax:  [phone]  Name: Crystal Thomas MRN: [id number] Date of Birth: 04-Jun-1941  PHYSICAL THERAPY DISCHARGE SUMMARY  Visits from Start of Care: 2  Current functional level related to goals / functional outcomes: See above. Patient did not return after her last visit on 09/24/2016.    Remaining deficits: See above.    Education / Equipment: HEP  Plan:                                                    Patient goals were not met.  Patient is being discharged due to not returning since the last visit. Thank you for the referral. Earlie Counts, PT 12/04/2015 3:46 PM   ?????

## 2015-10-15 ENCOUNTER — Ambulatory Visit: Payer: Medicare Other | Attending: Obstetrics & Gynecology | Admitting: Physical Therapy

## 2015-10-26 DIAGNOSIS — J069 Acute upper respiratory infection, unspecified: Secondary | ICD-10-CM | POA: Diagnosis not present

## 2015-11-27 DIAGNOSIS — M8588 Other specified disorders of bone density and structure, other site: Secondary | ICD-10-CM | POA: Diagnosis not present

## 2015-11-27 DIAGNOSIS — I7300 Raynaud's syndrome without gangrene: Secondary | ICD-10-CM | POA: Diagnosis not present

## 2015-11-27 DIAGNOSIS — G250 Essential tremor: Secondary | ICD-10-CM | POA: Diagnosis not present

## 2015-11-27 DIAGNOSIS — Z0000 Encounter for general adult medical examination without abnormal findings: Secondary | ICD-10-CM | POA: Diagnosis not present

## 2015-11-27 DIAGNOSIS — M519 Unspecified thoracic, thoracolumbar and lumbosacral intervertebral disc disorder: Secondary | ICD-10-CM | POA: Diagnosis not present

## 2015-11-27 DIAGNOSIS — M25551 Pain in right hip: Secondary | ICD-10-CM | POA: Diagnosis not present

## 2015-11-27 DIAGNOSIS — E7800 Pure hypercholesterolemia, unspecified: Secondary | ICD-10-CM | POA: Diagnosis not present

## 2015-11-27 DIAGNOSIS — Z1389 Encounter for screening for other disorder: Secondary | ICD-10-CM | POA: Diagnosis not present

## 2015-12-28 DIAGNOSIS — H5712 Ocular pain, left eye: Secondary | ICD-10-CM | POA: Diagnosis not present

## 2016-02-12 DIAGNOSIS — L821 Other seborrheic keratosis: Secondary | ICD-10-CM | POA: Diagnosis not present

## 2016-02-12 DIAGNOSIS — L820 Inflamed seborrheic keratosis: Secondary | ICD-10-CM | POA: Diagnosis not present

## 2016-04-22 ENCOUNTER — Encounter: Payer: Self-pay | Admitting: Gastroenterology

## 2016-11-18 ENCOUNTER — Encounter: Payer: Self-pay | Admitting: Gastroenterology

## 2016-11-23 ENCOUNTER — Encounter: Payer: Self-pay | Admitting: Physician Assistant

## 2016-11-30 ENCOUNTER — Encounter: Payer: Self-pay | Admitting: *Deleted

## 2016-12-01 DIAGNOSIS — I7300 Raynaud's syndrome without gangrene: Secondary | ICD-10-CM | POA: Diagnosis not present

## 2016-12-01 DIAGNOSIS — M519 Unspecified thoracic, thoracolumbar and lumbosacral intervertebral disc disorder: Secondary | ICD-10-CM | POA: Diagnosis not present

## 2016-12-01 DIAGNOSIS — Z0000 Encounter for general adult medical examination without abnormal findings: Secondary | ICD-10-CM | POA: Diagnosis not present

## 2016-12-01 DIAGNOSIS — G250 Essential tremor: Secondary | ICD-10-CM | POA: Diagnosis not present

## 2016-12-01 DIAGNOSIS — M8588 Other specified disorders of bone density and structure, other site: Secondary | ICD-10-CM | POA: Diagnosis not present

## 2016-12-01 DIAGNOSIS — E7800 Pure hypercholesterolemia, unspecified: Secondary | ICD-10-CM | POA: Diagnosis not present

## 2016-12-01 DIAGNOSIS — Z1389 Encounter for screening for other disorder: Secondary | ICD-10-CM | POA: Diagnosis not present

## 2016-12-02 ENCOUNTER — Ambulatory Visit (INDEPENDENT_AMBULATORY_CARE_PROVIDER_SITE_OTHER): Payer: Medicare Other | Admitting: Physician Assistant

## 2016-12-02 ENCOUNTER — Encounter (INDEPENDENT_AMBULATORY_CARE_PROVIDER_SITE_OTHER): Payer: Self-pay

## 2016-12-02 ENCOUNTER — Encounter: Payer: Self-pay | Admitting: Physician Assistant

## 2016-12-02 VITALS — BP 106/66 | HR 64 | Ht 63.5 in | Wt 164.5 lb

## 2016-12-02 DIAGNOSIS — R194 Change in bowel habit: Secondary | ICD-10-CM

## 2016-12-02 DIAGNOSIS — K6289 Other specified diseases of anus and rectum: Secondary | ICD-10-CM

## 2016-12-02 DIAGNOSIS — R195 Other fecal abnormalities: Secondary | ICD-10-CM

## 2016-12-02 MED ORDER — NA SULFATE-K SULFATE-MG SULF 17.5-3.13-1.6 GM/177ML PO SOLN: ORAL | 0 refills | Status: DC

## 2016-12-02 NOTE — Progress Notes (Signed)
Reviewed and agree with management plan.  Germaine Ripp T. Meiah Zamudio, MD FACG 

## 2016-12-02 NOTE — Patient Instructions (Signed)
You have been scheduled for a colonoscopy. Please follow written instructions given to you at your visit today.  Please pick up your prep supplies at the pharmacy within the next 1-3 days. If you use inhalers (even only as needed), please bring them with you on the day of your procedure. Your physician has requested that you go to www.startemmi.com and enter the access code given to you at your visit today. This web site gives a general overview about your procedure. However, you should still follow specific instructions given to you by our office regarding your preparation for the procedure.  If you are age 73 or older, your body mass index should be between 23-30. Your Body mass index is 28.68 kg/m. If this is out of the aforementioned range listed, please consider follow up with your Primary Care Provider.  If you are age 22 or younger, your body mass index should be between 19-25. Your Body mass index is 28.68 kg/m. If this is out of the aformentioned range listed, please consider follow up with your Primary Care Provider.

## 2016-12-02 NOTE — Progress Notes (Signed)
Subjective:    Patient ID: Crystal Thomas, female    DOB: 03/01/1941, 76 y.o.   MRN: [id number]  HPI Crystal Thomas is a pleasant 76 year old white female, known very remotely to Dr. Marga Hoots from colonoscopy done in August 2002 who comes in today with complaints of change in bowel habits and more frequent stools. Colonoscopy in 2002 was negative with the exception of internal and sternal hemorrhoids. She says over the past 6 weeks or so she has had more frequent bowel movements. She says when this initially started she actually had diarrhea for 3-4 days but then the more frequent looser stools have persisted. She says he is also noted that her stool has become much more narrow in caliber remains soft and somewhat stringy. She has not noticed any melena or hematochezia. She has had an unsettled feeling in her abdomen but no abdominal pain. She did have 1 episode of internal rectal pain recently which has not recurred. She started herself on probiotics couple of weeks ago and has noticed some minimal improvement but is still concerned because of these persistent change in her bowels. She's not had any recent antibiotics or change in medications. Family history negative for colon cancer and polyps as far she is aware.  Review of Systems Pertinent positive and negative review of systems were noted in the above HPI section.  All other review of systems was otherwise negative.  Outpatient Encounter Prescriptions as of 12/02/2016  Medication Sig  . AMBULATORY NON FORMULARY MEDICATION   . aspirin 81 MG tablet Take 81 mg by mouth daily.  . Astaxanthin 4 MG CAPS Take 1 tablet by mouth daily.  Jolyne Loa Grape-Goldenseal (BERBERINE COMPLEX PO) Take 2,000 mg by mouth daily.   . Coenzyme Q10 (CO Q 10) 100 MG CAPS Take 1 capsule by mouth daily.  Marland Kitchen estradiol (ESTRACE) 0.1 MG/GM vaginal cream Place 1 Applicatorful vaginally 2 (two) times a week.  Marland Kitchen GLUTATHIONE PO Take 250 mg by mouth daily.  .  Omega-3 Fatty Acids (FISH OIL BURP-LESS PO) Take 1 Dose by mouth daily.  . Probiotic Product (PROBIOTIC PO) Take 1 tablet by mouth daily.  . propranolol (INDERAL) 20 MG tablet Take 20 mg by mouth 3 (three) times daily. Take 1-2 tablets in the morning and one at night.  . Quercetin 250 MG TABS Take 1 tablet by mouth daily.  Marland Kitchen RESVERATROL PO Take 1 tablet by mouth daily.  . TURMERIC CURCUMIN PO Take 500 mg by mouth daily.   . valACYclovir (VALTREX) 1000 MG tablet Take 1,000 mg by mouth as needed.  . Na Sulfate-K Sulfate-Mg Sulf 17.5-3.13-1.6 GM/180ML SOLN Suprep-Use as directed   No facility-administered encounter medications on file as of 12/02/2016.    Allergies  Allergen Reactions  . Lipitor [Atorvastatin] Other (See Comments)    DECREASED RECALL, MEMORY PROBLEMS  . Sulfonamide Derivatives    Patient Active Problem List   Diagnosis Date Noted  . Dyspnea on exertion 01/23/2015  . Chest pain with moderate risk for cardiac etiology 01/23/2015  . HYPERLIPIDEMIA 02/10/2008  . ARTHRITIS 02/10/2008   Social History   Social History  . Marital status: Married    Spouse name: N/A  . Number of children: 4  . Years of education: N/A   Occupational History  . 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 Smoking status: Never Smoker  . Smokeless tobacco: Never Used  . Alcohol use No  . Drug use:  No  . Sexual activity: Not on file   Other Topics Concern  . Not on file   Social History Narrative  . No narrative on file    Crystal Thomas's family history includes Atrial fibrillation in her mother; Heart failure in her mother; Hyperlipidemia in her brother; Hypertension in her brother; Leukemia (age of onset: 2) in her son; Uterine cancer (age of onset: 59) in her mother.      Objective:    Vitals:   12/02/16 0940  BP: 106/66  Pulse: 64    Physical Exam  well-developed older white female in no acute distress, pleasant blood pressure 106/66 pulse 64, Height  5 foot 3, Weight 164, BMI 28.6. HEENT; nontraumatic normocephalic EOMI PERRLA sclera anicteric, Cardiovascular; regular rate and rhythm with S1-S2 no murmur or gallop, Pulmonary; clear bilaterally them and soft, nontender nondistended bowel sounds are active there is no palpable mass or hepatosplenomegaly, Rectal ;exam not done patient declined due to concerns for diarrhea/gas Extremities; no clubbing cyanosis or edema skin warm and dry, Neuropsych; mood and affect appropriate       Assessment & Plan:   #77 76 year old white female with 6 week history of change in bowel habits with looser more frequent stools and change in stool caliber with narrow, stringy bowel movements. Has also had some sensation of rectal pressure and one episode of rectal pain. Rule out occult rectal or colon lesion. Rule out a mild postinfectious IBS, rule out mild colitis #2 history of internal neck sternal hemorrhoids #3 hyperlipidemia  Plan; patient will be scheduled for colonoscopy with Dr. Fuller Plan. Procedure discussed in detail with patient including risks and benefits and she is agreeable to proceed. Further plans pending results of above. For now she will continue daily probiotic.  Amy S Esterwood PA-C 12/02/2016   Cc: Leighton Ruff, MD

## 2016-12-22 ENCOUNTER — Encounter: Payer: Self-pay | Admitting: Gastroenterology

## 2016-12-22 ENCOUNTER — Ambulatory Visit (AMBULATORY_SURGERY_CENTER): Payer: Medicare Other | Admitting: Gastroenterology

## 2016-12-22 VITALS — BP 113/51 | HR 61 | Temp 97.5°F | Resp 14 | Ht 63.5 in | Wt 164.0 lb

## 2016-12-22 DIAGNOSIS — Z1211 Encounter for screening for malignant neoplasm of colon: Secondary | ICD-10-CM | POA: Diagnosis not present

## 2016-12-22 DIAGNOSIS — R194 Change in bowel habit: Secondary | ICD-10-CM

## 2016-12-22 DIAGNOSIS — D125 Benign neoplasm of sigmoid colon: Secondary | ICD-10-CM

## 2016-12-22 DIAGNOSIS — R195 Other fecal abnormalities: Secondary | ICD-10-CM

## 2016-12-22 MED ORDER — SODIUM CHLORIDE 0.9 % IV SOLN: 500.0000 mL | INTRAVENOUS | Status: DC

## 2016-12-22 NOTE — Patient Instructions (Signed)
YOU HAD AN ENDOSCOPIC PROCEDURE TODAY AT [facility]:   Refer to the procedure report that was given to you for any specific questions about what was found during the examination.  If the procedure report does not answer your questions, please call your gastroenterologist to clarify.  If you requested that your care partner not be given the details of your procedure findings, then the procedure report has been included in a sealed envelope for you to review at your convenience later.  YOU SHOULD EXPECT: Some feelings of bloating in the abdomen. Passage of more gas than usual.  Walking can help get rid of the air that was put into your GI tract during the procedure and reduce the bloating. If you had a lower endoscopy (such as a colonoscopy or flexible sigmoidoscopy) you may notice spotting of blood in your stool or on the toilet paper. If you underwent a bowel prep for your procedure, you may not have a normal bowel movement for a few days.  Please Note:  You might notice some irritation and congestion in your nose or some drainage.  This is from the oxygen used during your procedure.  There is no need for concern and it should clear up in a day or so.  SYMPTOMS TO REPORT IMMEDIATELY:   Following lower endoscopy (colonoscopy or flexible sigmoidoscopy):  Excessive amounts of blood in the stool  Significant tenderness or worsening of abdominal pains  Swelling of the abdomen that is new, acute  Fever of 100F or higher    For urgent or emergent issues, a gastroenterologist can be reached at any hour by calling [phone].   DIET:  We do recommend a small meal at first, but then you may proceed to your regular diet.  Drink plenty of fluids but you should avoid alcoholic beverages for 24 hours.  ACTIVITY:  You should plan to take it easy for the rest of today and you should NOT DRIVE or use heavy machinery until tomorrow (because of the sedation medicines used during the test).     FOLLOW UP: Our staff will call the number listed on your records the next business day following your procedure to check on you and address any questions or concerns that you may have regarding the information given to you following your procedure. If we do not reach you, we will leave a message.  However, if you are feeling well and you are not experiencing any problems, there is no need to return our call.  We will assume that you have returned to your regular daily activities without incident.  If any biopsies were taken you will be contacted by phone or by letter within the next 1-3 weeks.  Please call us at [phone] if you have not heard about the biopsies in 3 weeks.    SIGNATURES/CONFIDENTIALITY: You and/or your care partner have signed paperwork which will be entered into your electronic medical record.  These signatures attest to the fact that that the information above on your After Visit Summary has been reviewed and is understood.  Full responsibility of the confidentiality of this discharge information lies with you and/or your care-partner.   Information on polyps and hemorrhoids given to you today.

## 2016-12-22 NOTE — Op Note (Signed)
[facility] Patient Name: Crystal Thomas Procedure Date: 12/22/2016 3:35 PM MRN: [id number] Endoscopist: Ladene Artist , MD Age: 76 Referring MD:  Date of Birth: [birth year] Gender: Female Account #: [id number] Procedure:                Colonoscopy Indications:              Change in bowel habits, Change in stool caliber Medicines:                Monitored Anesthesia Care Procedure:                Pre-Anesthesia Assessment:                           - Prior to the procedure, a History and Physical                            was performed, and patient medications and                            allergies were reviewed. The patient's tolerance of                            previous anesthesia was also reviewed. The risks                            and benefits of the procedure and the sedation                            options and risks were discussed with the patient.                            All questions were answered, and informed consent                            was obtained. Prior Anticoagulants: The patient has                            taken no previous anticoagulant or antiplatelet                            agents. ASA Grade Assessment: II - A patient with                            mild systemic disease. After reviewing the risks                            and benefits, the patient was deemed in                            satisfactory condition to undergo the procedure.                           After obtaining informed consent, the colonoscope  was passed under direct vision. Throughout the                            procedure, the patient's blood pressure, pulse, and                            oxygen saturations were monitored continuously. The                            Model PCF-H190DL [id number]) scope was introduced                            through the anus and advanced to the the cecum,   identified by appendiceal orifice and ileocecal                            valve. The ileocecal valve, appendiceal orifice,                            and rectum were photographed. The quality of the                            bowel preparation was excellent. The colonoscopy                            was performed without difficulty. The patient                            tolerated the procedure well. Scope In: 3:46:37 PM Scope Out: 4:03:33 PM Scope Withdrawal Time: 0 hours 11 minutes 2 seconds  Total Procedure Duration: 0 hours 16 minutes 56 seconds  Findings:                 The perianal and digital rectal examinations were                            normal.                           A 5 mm polyp was found in the sigmoid colon. The                            polyp was sessile. The polyp was removed with a                            cold snare. Resection and retrieval were complete.                           Internal hemorrhoids were found during                            retroflexion. The hemorrhoids were small and Grade                            I (internal hemorrhoids that do  not prolapse).                           A localized area of mild melanosis was found in the                            sigmoid colon and in the descending colon.                           The exam was otherwise without abnormality on                            direct and retroflexion views. Complications:            No immediate complications. Estimated blood loss:                            None. Estimated Blood Loss:     Estimated blood loss: none. Impression:               - One 5 mm polyp in the sigmoid colon, removed with                            a cold snare. Resected and retrieved.                           - Internal hemorrhoids.                           - Melanosis in the colon.                           - The examination was otherwise normal on direct                            and retroflexion  views. Recommendation:           - Patient has a contact number available for                            emergencies. The signs and symptoms of potential                            delayed complications were discussed with the                            patient. Return to normal activities tomorrow.                            Written discharge instructions were provided to the                            patient.                           - Resume previous diet.                           -  Continue present medications.                           - Await pathology results.                           - No repeat colonoscopy due to age. Ladene Artist, MD 12/22/2016 4:06:57 PM This report has been signed electronically.

## 2016-12-22 NOTE — Progress Notes (Signed)
Pt's states no medical or surgical changes since previsit or office visit. 

## 2016-12-22 NOTE — Progress Notes (Signed)
Called to room to assist during endoscopic procedure.  Patient ID and intended procedure confirmed with present staff. Received instructions for my participation in the procedure from the performing physician.  

## 2016-12-22 NOTE — Progress Notes (Signed)
A and O x3. Report to RN. Tolerated MAC anesthesia well.

## 2016-12-23 ENCOUNTER — Telehealth: Payer: Self-pay

## 2016-12-23 NOTE — Telephone Encounter (Signed)
  Follow up Call-  Call back number 12/22/2016  Post procedure Call Back phone  # [phone]  Permission to leave phone message Yes  Some recent data might be hidden     Patient questions:  Do you have a fever, pain , or abdominal swelling? No. Pain Score  0 *  Have you tolerated food without any problems? Yes.    Have you been able to return to your normal activities? Yes.    Do you have any questions about your discharge instructions: Diet   No. Medications  No. Follow up visit  No.  Do you have questions or concerns about your Care? No.  Actions: * If pain score is 4 or above: No action needed, pain <4.  No problems per pt. maw

## 2016-12-24 ENCOUNTER — Telehealth: Payer: Self-pay | Admitting: Gastroenterology

## 2016-12-24 NOTE — Telephone Encounter (Signed)
Left message for patient to call back  

## 2016-12-28 NOTE — Telephone Encounter (Signed)
Left message for patient to call back  

## 2016-12-31 NOTE — Telephone Encounter (Signed)
Pt states she feels much better and will call with any future complaints

## 2017-01-01 ENCOUNTER — Encounter: Payer: Self-pay | Admitting: Gastroenterology

## 2017-01-20 ENCOUNTER — Encounter: Payer: Medicare Other | Admitting: Gastroenterology

## 2017-02-17 DIAGNOSIS — M8588 Other specified disorders of bone density and structure, other site: Secondary | ICD-10-CM | POA: Diagnosis not present

## 2017-03-07 DIAGNOSIS — M940 Chondrocostal junction syndrome [Tietze]: Secondary | ICD-10-CM | POA: Diagnosis not present

## 2017-04-04 DIAGNOSIS — W57XXXA Bitten or stung by nonvenomous insect and other nonvenomous arthropods, initial encounter: Secondary | ICD-10-CM | POA: Diagnosis not present

## 2017-04-04 DIAGNOSIS — L299 Pruritus, unspecified: Secondary | ICD-10-CM | POA: Diagnosis not present

## 2017-11-24 DIAGNOSIS — I8312 Varicose veins of left lower extremity with inflammation: Secondary | ICD-10-CM | POA: Diagnosis not present

## 2017-11-24 DIAGNOSIS — I8311 Varicose veins of right lower extremity with inflammation: Secondary | ICD-10-CM | POA: Diagnosis not present

## 2017-12-08 DIAGNOSIS — I87323 Chronic venous hypertension (idiopathic) with inflammation of bilateral lower extremity: Secondary | ICD-10-CM | POA: Diagnosis not present

## 2018-02-03 DIAGNOSIS — H43813 Vitreous degeneration, bilateral: Secondary | ICD-10-CM | POA: Diagnosis not present

## 2018-02-09 DIAGNOSIS — H2513 Age-related nuclear cataract, bilateral: Secondary | ICD-10-CM | POA: Diagnosis not present

## 2018-02-09 DIAGNOSIS — I87323 Chronic venous hypertension (idiopathic) with inflammation of bilateral lower extremity: Secondary | ICD-10-CM | POA: Diagnosis not present

## 2018-04-01 DIAGNOSIS — I8311 Varicose veins of right lower extremity with inflammation: Secondary | ICD-10-CM | POA: Diagnosis not present

## 2018-04-01 DIAGNOSIS — I8312 Varicose veins of left lower extremity with inflammation: Secondary | ICD-10-CM | POA: Diagnosis not present

## 2018-04-18 DIAGNOSIS — Z0000 Encounter for general adult medical examination without abnormal findings: Secondary | ICD-10-CM | POA: Diagnosis not present

## 2018-04-18 DIAGNOSIS — G250 Essential tremor: Secondary | ICD-10-CM | POA: Diagnosis not present

## 2018-04-18 DIAGNOSIS — N952 Postmenopausal atrophic vaginitis: Secondary | ICD-10-CM | POA: Diagnosis not present

## 2018-04-18 DIAGNOSIS — E7800 Pure hypercholesterolemia, unspecified: Secondary | ICD-10-CM | POA: Diagnosis not present

## 2018-04-18 DIAGNOSIS — Z136 Encounter for screening for cardiovascular disorders: Secondary | ICD-10-CM | POA: Diagnosis not present

## 2018-04-18 DIAGNOSIS — Z1389 Encounter for screening for other disorder: Secondary | ICD-10-CM | POA: Diagnosis not present

## 2018-04-27 DIAGNOSIS — H2511 Age-related nuclear cataract, right eye: Secondary | ICD-10-CM | POA: Diagnosis not present

## 2018-05-03 ENCOUNTER — Encounter: Payer: Self-pay | Admitting: *Deleted

## 2018-05-03 ENCOUNTER — Other Ambulatory Visit: Payer: Self-pay

## 2018-05-03 NOTE — Anesthesia Preprocedure Evaluation (Addendum)
Anesthesia Evaluation  Patient identified by MRN, date of birth, ID band Patient awake    Reviewed: Allergy & Precautions, H&P , NPO status , Patient's Chart, lab work & pertinent test results, reviewed documented beta blocker date and time   Airway Mallampati: II  TM Distance: >3 FB Neck ROM: full    Dental no notable dental hx.    Pulmonary neg pulmonary ROS,    Pulmonary exam normal breath sounds clear to auscultation       Cardiovascular Exercise Tolerance: Good + Peripheral Vascular Disease  negative cardio ROS   Rhythm:regular Rate:Normal     Neuro/Psych negative neurological ROS  negative psych ROS   GI/Hepatic negative GI ROS, Neg liver ROS,   Endo/Other  negative endocrine ROS  Renal/GU negative Renal ROS  negative genitourinary   Musculoskeletal   Abdominal   Peds  Hematology negative hematology ROS (+)   Anesthesia Other Findings   Reproductive/Obstetrics negative OB ROS                             Anesthesia Physical Anesthesia Plan  ASA: II  Anesthesia Plan: General and MAC   Post-op Pain Management:    Induction:   PONV Risk Score and Plan:   Airway Management Planned:   Additional Equipment:   Intra-op Plan:   Post-operative Plan:   Informed Consent: I have reviewed the patients History and Physical, chart, labs and discussed the procedure including the risks, benefits and alternatives for the proposed anesthesia with the patient or authorized representative who has indicated his/her understanding and acceptance.   Dental Advisory Given  Plan Discussed with: CRNA  Anesthesia Plan Comments:         Anesthesia Quick Evaluation

## 2018-05-04 NOTE — Discharge Instructions (Signed)

## 2018-05-11 ENCOUNTER — Ambulatory Visit
Admission: RE | Admit: 2018-05-11 | Discharge: 2018-05-11 | Disposition: A | Discharge status: Home / Self Care | Payer: Medicare Other | Source: Ambulatory Visit | Attending: Ophthalmology | Admitting: Ophthalmology

## 2018-05-11 ENCOUNTER — Ambulatory Visit: Payer: Medicare Other | Admitting: Anesthesiology

## 2018-05-11 ENCOUNTER — Encounter
Admission: RE | Disposition: A | Discharge status: Home / Self Care | Payer: Self-pay | Source: Ambulatory Visit | Attending: Ophthalmology

## 2018-05-11 ENCOUNTER — Encounter: Payer: Self-pay | Admitting: Ophthalmology

## 2018-05-11 DIAGNOSIS — Z79899 Other long term (current) drug therapy: Secondary | ICD-10-CM | POA: Insufficient documentation

## 2018-05-11 DIAGNOSIS — H25811 Combined forms of age-related cataract, right eye: Secondary | ICD-10-CM | POA: Diagnosis not present

## 2018-05-11 DIAGNOSIS — H2511 Age-related nuclear cataract, right eye: Secondary | ICD-10-CM | POA: Insufficient documentation

## 2018-05-11 DIAGNOSIS — I209 Angina pectoris, unspecified: Secondary | ICD-10-CM | POA: Diagnosis not present

## 2018-05-11 DIAGNOSIS — I739 Peripheral vascular disease, unspecified: Secondary | ICD-10-CM | POA: Diagnosis not present

## 2018-05-11 DIAGNOSIS — M8580 Other specified disorders of bone density and structure, unspecified site: Secondary | ICD-10-CM | POA: Insufficient documentation

## 2018-05-11 HISTORY — DX: Raynaud's syndrome without gangrene: I73.00

## 2018-05-11 HISTORY — DX: Venous insufficiency (chronic) (peripheral): I87.2

## 2018-05-11 HISTORY — PX: CATARACT EXTRACTION W/PHACO: SHX586

## 2018-05-11 HISTORY — DX: Unspecified disease of inner ear, unspecified ear: H83.90

## 2018-05-11 SURGERY — PHACOEMULSIFICATION, CATARACT, WITH IOL INSERTION
Anesthesia: Monitor Anesthesia Care | Site: Eye | Laterality: Right | Wound class: Clean

## 2018-05-11 MED ORDER — PHENYLEPHRINE HCL 10 % OP SOLN
1.0000 [drp] | OPHTHALMIC | Status: DC | PRN
  Administered 2018-05-11 (×3): 1 [drp] via OPHTHALMIC

## 2018-05-11 MED ORDER — NA HYALUR & NA CHOND-NA HYALUR 0.4-0.35 ML IO KIT
PACK | INTRAOCULAR | Status: DC | PRN
  Administered 2018-05-11: 1 mL via INTRAOCULAR

## 2018-05-11 MED ORDER — LACTATED RINGERS IV SOLN: INTRAVENOUS | Status: DC

## 2018-05-11 MED ORDER — FENTANYL CITRATE (PF) 100 MCG/2ML IJ SOLN
INTRAMUSCULAR | Status: DC | PRN
  Administered 2018-05-11: 50 ug via INTRAVENOUS

## 2018-05-11 MED ORDER — BRIMONIDINE TARTRATE-TIMOLOL 0.2-0.5 % OP SOLN
OPHTHALMIC | Status: DC | PRN
  Administered 2018-05-11: 1 [drp] via OPHTHALMIC

## 2018-05-11 MED ORDER — MOXIFLOXACIN HCL 0.5 % OP SOLN
1.0000 [drp] | OPHTHALMIC | Status: DC | PRN
  Administered 2018-05-11 (×3): 1 [drp] via OPHTHALMIC

## 2018-05-11 MED ORDER — CYCLOPENTOLATE HCL 2 % OP SOLN
1.0000 [drp] | OPHTHALMIC | Status: DC | PRN
  Administered 2018-05-11 (×3): 1 [drp] via OPHTHALMIC

## 2018-05-11 MED ORDER — CEFUROXIME OPHTHALMIC INJECTION 1 MG/0.1 ML
INJECTION | OPHTHALMIC | Status: DC | PRN
  Administered 2018-05-11: 0.1 mL via OPHTHALMIC

## 2018-05-11 MED ORDER — LIDOCAINE HCL (PF) 2 % IJ SOLN
INTRAOCULAR | Status: DC | PRN
  Administered 2018-05-11: 1 mL

## 2018-05-11 MED ORDER — BSS IO SOLN
INTRAOCULAR | Status: DC | PRN
  Administered 2018-05-11: 56 mL via OPHTHALMIC

## 2018-05-11 MED ORDER — TETRACAINE HCL 0.5 % OP SOLN
1.0000 [drp] | OPHTHALMIC | Status: DC | PRN
  Administered 2018-05-11 (×2): 1 [drp] via OPHTHALMIC

## 2018-05-11 MED ORDER — MIDAZOLAM HCL 2 MG/2ML IJ SOLN
INTRAMUSCULAR | Status: DC | PRN
  Administered 2018-05-11: 2 mg via INTRAVENOUS

## 2018-05-11 SURGICAL SUPPLY — 27 items

## 2018-05-11 NOTE — Addendum Note (Signed)
Addendum  created 05/11/18 1457 by Marice Potter, MD   SmartForm saved

## 2018-05-11 NOTE — H&P (Signed)
The History and Physical notes are on paper, have been signed, and are to be scanned. The patient remains stable and unchanged from the H&P.   Previous H&P reviewed, patient examined, and there are no changes.  Crystal Thomas 05/11/2018 9:37 AM

## 2018-05-11 NOTE — Anesthesia Procedure Notes (Signed)
Procedure Name: MAC Date/Time: 05/11/2018 10:57 AM Performed by: Janna Arch, CRNA Pre-anesthesia Checklist: Patient identified, Emergency Drugs available, Suction available, Patient being monitored and Timeout performed Patient Re-evaluated:Patient Re-evaluated prior to induction Oxygen Delivery Method: Nasal cannula Preoxygenation: Pre-oxygenation with 100% oxygen Placement Confirmation: positive ETCO2 Dental Injury: Teeth and Oropharynx as per pre-operative assessment

## 2018-05-11 NOTE — Op Note (Signed)
LOCATION:  [place]   PREOPERATIVE DIAGNOSIS:    Nuclear sclerotic cataract right eye. H25.11   POSTOPERATIVE DIAGNOSIS:  Nuclear sclerotic cataract right eye.     PROCEDURE:  Phacoemusification with posterior chamber intraocular lens placement of the right eye   LENS:   Implant Name Type Inv. Item Serial No. Manufacturer Lot No. LRB No. Used  LENS IOL DIOP 18.0 - [id number] Intraocular Lens LENS IOL DIOP 18.0 [id number]  Right 1        ULTRASOUND TIME: 21 % of 1 minutes, 1 seconds.  CDE 13.2   SURGEON:  Wyonia Hough, MD   ANESTHESIA:  Topical with tetracaine drops and 2% Xylocaine jelly, augmented with 1% preservative-free intracameral lidocaine.    COMPLICATIONS:  None.   DESCRIPTION OF PROCEDURE:  The patient was identified in the holding room and transported to the operating room and placed in the supine position under the operating microscope.  The right eye was identified as the operative eye and it was prepped and draped in the usual sterile ophthalmic fashion.   A 1 millimeter clear-corneal paracentesis was made at the 12:00 position.  0.5 ml of preservative-free 1% lidocaine was injected into the anterior chamber. The anterior chamber was filled with Viscoat viscoelastic.  A 2.4 millimeter keratome was used to make a near-clear corneal incision at the 9:00 position.  A curvilinear capsulorrhexis was made with a cystotome and capsulorrhexis forceps.  Balanced salt solution was used to hydrodissect and hydrodelineate the nucleus.   Phacoemulsification was then used in stop and chop fashion to remove the lens nucleus and epinucleus.  The remaining cortex was then removed using the irrigation and aspiration handpiece. Provisc was then placed into the capsular bag to distend it for lens placement.  A lens was then injected into the capsular bag.  The remaining viscoelastic was aspirated.   Wounds were hydrated with balanced salt solution.  The anterior  chamber was inflated to a physiologic pressure with balanced salt solution.  No wound leaks were noted. Cefuroxime 0.1 ml of a 10mg /ml solution was injected into the anterior chamber for a dose of 1 mg of intracameral antibiotic at the completion of the case.   Timolol and Brimonidine drops were applied to the eye.  The patient was taken to the recovery room in stable condition without complications of anesthesia or surgery.   Sybilla Malhotra 05/11/2018, 11:08 AM

## 2018-05-11 NOTE — Anesthesia Postprocedure Evaluation (Signed)
Anesthesia Post Note  Patient: Crystal Thomas  Procedure(s) Performed: CATARACT EXTRACTION PHACO AND INTRAOCULAR LENS PLACEMENT (IOC) RIGHT (Right Eye)  Patient location during evaluation: PACU Anesthesia Type: MAC Level of consciousness: awake and alert Pain management: pain level controlled Vital Signs Assessment: post-procedure vital signs reviewed and stable Respiratory status: spontaneous breathing, nonlabored ventilation, respiratory function stable and patient connected to nasal cannula oxygen Cardiovascular status: stable and blood pressure returned to baseline Postop Assessment: no apparent nausea or vomiting Anesthetic complications: no    SCOURAS, NICOLE ELAINE

## 2018-05-11 NOTE — Transfer of Care (Signed)
Immediate Anesthesia Transfer of Care Note  Patient: Crystal Thomas  Procedure(s) Performed: CATARACT EXTRACTION PHACO AND INTRAOCULAR LENS PLACEMENT (IOC) RIGHT (Right Eye)  Patient Location: PACU  Anesthesia Type: General, MAC  Level of Consciousness: awake, alert  and patient cooperative  Airway and Oxygen Therapy: Patient Spontanous Breathing and Patient connected to supplemental oxygen  Post-op Assessment: Post-op Vital signs reviewed, Patient's Cardiovascular Status Stable, Respiratory Function Stable, Patent Airway and No signs of Nausea or vomiting  Post-op Vital Signs: Reviewed and stable  Complications: No apparent anesthesia complications

## 2018-05-26 NOTE — Discharge Instructions (Signed)

## 2018-05-27 DIAGNOSIS — H2512 Age-related nuclear cataract, left eye: Secondary | ICD-10-CM | POA: Diagnosis not present

## 2018-05-30 ENCOUNTER — Encounter: Payer: Self-pay | Admitting: *Deleted

## 2018-05-30 ENCOUNTER — Other Ambulatory Visit: Payer: Self-pay

## 2018-06-01 ENCOUNTER — Encounter
Admission: RE | Disposition: A | Discharge status: Home / Self Care | Payer: Self-pay | Source: Ambulatory Visit | Attending: Ophthalmology

## 2018-06-01 ENCOUNTER — Ambulatory Visit: Payer: Medicare Other | Admitting: Anesthesiology

## 2018-06-01 ENCOUNTER — Ambulatory Visit
Admission: RE | Admit: 2018-06-01 | Discharge: 2018-06-01 | Disposition: A | Discharge status: Home / Self Care | Payer: Medicare Other | Source: Ambulatory Visit | Attending: Ophthalmology | Admitting: Ophthalmology

## 2018-06-01 DIAGNOSIS — I872 Venous insufficiency (chronic) (peripheral): Secondary | ICD-10-CM | POA: Insufficient documentation

## 2018-06-01 DIAGNOSIS — E7800 Pure hypercholesterolemia, unspecified: Secondary | ICD-10-CM | POA: Diagnosis not present

## 2018-06-01 DIAGNOSIS — H2512 Age-related nuclear cataract, left eye: Secondary | ICD-10-CM | POA: Insufficient documentation

## 2018-06-01 DIAGNOSIS — K219 Gastro-esophageal reflux disease without esophagitis: Secondary | ICD-10-CM | POA: Insufficient documentation

## 2018-06-01 DIAGNOSIS — Z79899 Other long term (current) drug therapy: Secondary | ICD-10-CM | POA: Insufficient documentation

## 2018-06-01 DIAGNOSIS — H25812 Combined forms of age-related cataract, left eye: Secondary | ICD-10-CM | POA: Diagnosis not present

## 2018-06-01 HISTORY — PX: CATARACT EXTRACTION W/PHACO: SHX586

## 2018-06-01 SURGERY — PHACOEMULSIFICATION, CATARACT, WITH IOL INSERTION
Anesthesia: Monitor Anesthesia Care | Site: Eye | Laterality: Left | Wound class: Clean

## 2018-06-01 MED ORDER — ONDANSETRON HCL 4 MG/2ML IJ SOLN: 4.0000 mg | Freq: Once | INTRAMUSCULAR | Status: DC

## 2018-06-01 MED ORDER — ARMC OPHTHALMIC DILATING DROPS
1.0000 "application " | OPHTHALMIC | Status: DC | PRN
  Administered 2018-06-01 (×3): 1 via OPHTHALMIC

## 2018-06-01 MED ORDER — ONDANSETRON HCL 4 MG/2ML IJ SOLN
4.0000 mg | Freq: Once | INTRAMUSCULAR | Status: AC | PRN
  Administered 2018-06-01: 4 mg via INTRAVENOUS

## 2018-06-01 MED ORDER — MIDAZOLAM HCL 2 MG/2ML IJ SOLN
INTRAMUSCULAR | Status: DC | PRN
  Administered 2018-06-01: 2 mg via INTRAVENOUS

## 2018-06-01 MED ORDER — BRIMONIDINE TARTRATE-TIMOLOL 0.2-0.5 % OP SOLN
OPHTHALMIC | Status: DC | PRN
  Administered 2018-06-01: 1 [drp] via OPHTHALMIC

## 2018-06-01 MED ORDER — FENTANYL CITRATE (PF) 100 MCG/2ML IJ SOLN
INTRAMUSCULAR | Status: DC | PRN
  Administered 2018-06-01 (×2): 50 ug via INTRAVENOUS

## 2018-06-01 MED ORDER — EPINEPHRINE PF 1 MG/ML IJ SOLN
INTRAOCULAR | Status: DC | PRN
  Administered 2018-06-01: 63 mL via OPHTHALMIC

## 2018-06-01 MED ORDER — NA HYALUR & NA CHOND-NA HYALUR 0.4-0.35 ML IO KIT
PACK | INTRAOCULAR | Status: DC | PRN
  Administered 2018-06-01: 1 mL via INTRAOCULAR

## 2018-06-01 MED ORDER — LACTATED RINGERS IV SOLN
10.0000 mL/h | INTRAVENOUS | Status: DC
  Administered 2018-06-01: 10 mL/h via INTRAVENOUS

## 2018-06-01 MED ORDER — MOXIFLOXACIN HCL 0.5 % OP SOLN
1.0000 [drp] | OPHTHALMIC | Status: DC | PRN
  Administered 2018-06-01 (×3): 1 [drp] via OPHTHALMIC

## 2018-06-01 MED ORDER — CEFUROXIME OPHTHALMIC INJECTION 1 MG/0.1 ML
INJECTION | OPHTHALMIC | Status: DC | PRN
  Administered 2018-06-01: 0.1 mL via INTRACAMERAL

## 2018-06-01 MED ORDER — LIDOCAINE HCL (PF) 2 % IJ SOLN
INTRAOCULAR | Status: DC | PRN
  Administered 2018-06-01: 1 mL

## 2018-06-01 SURGICAL SUPPLY — 20 items
CANNULA ANT/CHMB 27G (MISCELLANEOUS) ×1 IMPLANT
CANNULA ANT/CHMB 27GA (MISCELLANEOUS) ×2 IMPLANT
GLOVE SURG LX 7.5 STRW (GLOVE) ×1
GLOVE SURG LX STRL 7.5 STRW (GLOVE) ×1 IMPLANT
GLOVE SURG TRIUMPH 8.0 PF LTX (GLOVE) ×2 IMPLANT
GOWN STRL REUS W/ TWL LRG LVL3 (GOWN DISPOSABLE) ×2 IMPLANT
GOWN STRL REUS W/TWL LRG LVL3 (GOWN DISPOSABLE) ×4
LENS IOL TECNIS ITEC 18.5 (Intraocular Lens) ×1 IMPLANT
MARKER SKIN DUAL TIP RULER LAB (MISCELLANEOUS) ×2 IMPLANT
NDL FILTER BLUNT 18X1 1/2 (NEEDLE) ×1 IMPLANT
NEEDLE FILTER BLUNT 18X 1/2SAF (NEEDLE) ×1
NEEDLE FILTER BLUNT 18X1 1/2 (NEEDLE) ×1 IMPLANT
PACK CATARACT BRASINGTON (MISCELLANEOUS) ×2 IMPLANT
PACK EYE AFTER SURG (MISCELLANEOUS) ×2 IMPLANT
PACK OPTHALMIC (MISCELLANEOUS) ×2 IMPLANT
SYR 3ML LL SCALE MARK (SYRINGE) ×2 IMPLANT
SYR 5ML LL (SYRINGE) ×2 IMPLANT
SYR TB 1ML LUER SLIP (SYRINGE) ×2 IMPLANT
WATER STERILE IRR 500ML POUR (IV SOLUTION) ×2 IMPLANT
WIPE NON LINTING 3.25X3.25 (MISCELLANEOUS) ×2 IMPLANT

## 2018-06-01 NOTE — Anesthesia Postprocedure Evaluation (Signed)
Anesthesia Post Note  Patient: Crystal Thomas  Procedure(s) Performed: CATARACT EXTRACTION PHACO AND INTRAOCULAR LENS PLACEMENT (IOC) (Left Eye)  Patient location during evaluation: PACU Anesthesia Type: MAC Level of consciousness: awake and alert Pain management: pain level controlled Vital Signs Assessment: post-procedure vital signs reviewed and stable Respiratory status: spontaneous breathing, nonlabored ventilation, respiratory function stable and patient connected to nasal cannula oxygen Cardiovascular status: stable and blood pressure returned to baseline Postop Assessment: no apparent nausea or vomiting Anesthetic complications: no    Veda Canning

## 2018-06-01 NOTE — Transfer of Care (Signed)
Immediate Anesthesia Transfer of Care Note  Patient: Crystal Thomas  Procedure(s) Performed: CATARACT EXTRACTION PHACO AND INTRAOCULAR LENS PLACEMENT (IOC) (Left Eye)  Patient Location: PACU  Anesthesia Type: MAC  Level of Consciousness: awake, alert  and patient cooperative  Airway and Oxygen Therapy: Patient Spontanous Breathing and Patient connected to supplemental oxygen  Post-op Assessment: Post-op Vital signs reviewed, Patient's Cardiovascular Status Stable, Respiratory Function Stable, Patent Airway and No signs of Nausea or vomiting  Post-op Vital Signs: Reviewed and stable  Complications: No apparent anesthesia complications

## 2018-06-01 NOTE — H&P (Signed)
The History and Physical notes are on paper, have been signed, and are to be scanned. The patient remains stable and unchanged from the H&P.   Previous H&P reviewed, patient examined, and there are no changes.  Crystal Thomas 06/01/2018 8:56 AM

## 2018-06-01 NOTE — Anesthesia Procedure Notes (Signed)
Procedure Name: MAC Performed by: Tydus Sanmiguel, CRNA Pre-anesthesia Checklist: Patient identified, Emergency Drugs available, Suction available, Timeout performed and Patient being monitored Patient Re-evaluated:Patient Re-evaluated prior to induction Oxygen Delivery Method: Nasal cannula Placement Confirmation: positive ETCO2       

## 2018-06-01 NOTE — Op Note (Signed)
OPERATIVE NOTE  Crystal Thomas [id number] 06/01/2018   PREOPERATIVE DIAGNOSIS:  Nuclear sclerotic cataract left eye. H25.12   POSTOPERATIVE DIAGNOSIS:    Nuclear sclerotic cataract left eye.     PROCEDURE:  Phacoemusification with posterior chamber intraocular lens placement of the left eye   LENS:   Implant Name Type Inv. Item Serial No. Manufacturer Lot No. LRB No. Used  LENS IOL DIOP 18.5 - [id number] Intraocular Lens LENS IOL DIOP 18.5 [id number] AMO  Left 1        ULTRASOUND TIME: 14  % of 1 minutes 18 seconds, CDE 10.8  SURGEON:  Wyonia Hough, MD   ANESTHESIA:  Topical with tetracaine drops and 2% Xylocaine jelly, augmented with 1% preservative-free intracameral lidocaine.    COMPLICATIONS:  None.   DESCRIPTION OF PROCEDURE:  The patient was identified in the holding room and transported to the operating room and placed in the supine position under the operating microscope.  The left eye was identified as the operative eye and it was prepped and draped in the usual sterile ophthalmic fashion.   A 1 millimeter clear-corneal paracentesis was made at the 1:30 position.  0.5 ml of preservative-free 1% lidocaine was injected into the anterior chamber.  The anterior chamber was filled with Viscoat viscoelastic.  A 2.4 millimeter keratome was used to make a near-clear corneal incision at the 10:30 position.  .  A curvilinear capsulorrhexis was made with a cystotome and capsulorrhexis forceps.  Balanced salt solution was used to hydrodissect and hydrodelineate the nucleus.   Phacoemulsification was then used in stop and chop fashion to remove the lens nucleus and epinucleus.  The remaining cortex was then removed using the irrigation and aspiration handpiece. Provisc was then placed into the capsular bag to distend it for lens placement.  A lens was then injected into the capsular bag.  The remaining viscoelastic was aspirated.   Wounds were hydrated with balanced salt  solution.  The anterior chamber was inflated to a physiologic pressure with balanced salt solution.  No wound leaks were noted. Cefuroxime 0.1 ml of a 10mg /ml solution was injected into the anterior chamber for a dose of 1 mg of intracameral antibiotic at the completion of the case.   Timolol and Brimonidine drops were applied to the eye.  The patient was taken to the recovery room in stable condition without complications of anesthesia or surgery.  [name] 06/01/2018, 10:14 AM

## 2018-06-01 NOTE — Anesthesia Preprocedure Evaluation (Signed)
Anesthesia Evaluation  Patient identified by MRN, date of birth, ID band Patient awake    Reviewed: Allergy & Precautions, H&P , NPO status , Patient's Chart, lab work & pertinent test results  Airway Mallampati: II  TM Distance: >3 FB     Dental no notable dental hx.    Pulmonary neg pulmonary ROS,    breath sounds clear to auscultation       Cardiovascular Exercise Tolerance: Good + Peripheral Vascular Disease  negative cardio ROS   Rhythm:regular Rate:Normal     Neuro/Psych negative neurological ROS     GI/Hepatic negative GI ROS,   Endo/Other  negative endocrine ROS  Renal/GU      Musculoskeletal   Abdominal   Peds  Hematology negative hematology ROS (+)   Anesthesia Other Findings   Reproductive/Obstetrics                             Anesthesia Physical  Anesthesia Plan  ASA: II  Anesthesia Plan: MAC   Post-op Pain Management:    Induction:   PONV Risk Score and Plan:   Airway Management Planned:   Additional Equipment:   Intra-op Plan:   Post-operative Plan:   Informed Consent: I have reviewed the patients History and Physical, chart, labs and discussed the procedure including the risks, benefits and alternatives for the proposed anesthesia with the patient or authorized representative who has indicated his/her understanding and acceptance.     Plan Discussed with: CRNA  Anesthesia Plan Comments:         Anesthesia Quick Evaluation

## 2018-06-02 ENCOUNTER — Encounter: Payer: Self-pay | Admitting: Ophthalmology

## 2018-06-15 DIAGNOSIS — I788 Other diseases of capillaries: Secondary | ICD-10-CM | POA: Diagnosis not present

## 2018-06-15 DIAGNOSIS — M79651 Pain in right thigh: Secondary | ICD-10-CM | POA: Diagnosis not present

## 2018-08-25 DIAGNOSIS — H02832 Dermatochalasis of right lower eyelid: Secondary | ICD-10-CM | POA: Diagnosis not present

## 2018-08-31 DIAGNOSIS — R251 Tremor, unspecified: Secondary | ICD-10-CM | POA: Diagnosis not present

## 2019-05-09 ENCOUNTER — Other Ambulatory Visit: Payer: Self-pay | Admitting: Family Medicine

## 2019-05-09 DIAGNOSIS — M8580 Other specified disorders of bone density and structure, unspecified site: Secondary | ICD-10-CM

## 2019-05-09 DIAGNOSIS — Z1231 Encounter for screening mammogram for malignant neoplasm of breast: Secondary | ICD-10-CM

## 2019-07-28 ENCOUNTER — Other Ambulatory Visit: Payer: Self-pay

## 2019-07-28 ENCOUNTER — Ambulatory Visit
Admission: RE | Admit: 2019-07-28 | Discharge: 2019-07-28 | Disposition: A | Discharge status: Home / Self Care | Payer: 59 | Source: Ambulatory Visit | Attending: Family Medicine | Admitting: Family Medicine

## 2019-07-28 ENCOUNTER — Ambulatory Visit
Admission: RE | Admit: 2019-07-28 | Discharge: 2019-07-28 | Disposition: A | Discharge status: Home / Self Care | Payer: Medicare Other | Source: Ambulatory Visit | Attending: Family Medicine | Admitting: Family Medicine

## 2019-07-28 DIAGNOSIS — Z1231 Encounter for screening mammogram for malignant neoplasm of breast: Secondary | ICD-10-CM

## 2019-07-28 DIAGNOSIS — M8580 Other specified disorders of bone density and structure, unspecified site: Secondary | ICD-10-CM

## 2019-11-14 NOTE — Progress Notes (Signed)
Cardiology Office Note:   Date:  11/16/2019  NAME:  Crystal Thomas    MRN: [id number] DOB:  07-29-1941   PCP:  Leighton Ruff, MD  Cardiologist:  No primary care provider on file.   Referring MD: Leighton Ruff, MD   Chief Complaint  Patient presents with  . Shortness of Breath   History of Present Illness:   Crystal Thomas is a 79 y.o. female with a hx of venous insufficiency, hypertension who is being seen today for the evaluation of shortness of breath at the request of Leighton Ruff, MD.  She presents for evaluation of 3 to 5 months of shortness of breath.  It occurs periodically 1-2 times per week.  It occurs with heavy exertion but not every time.  She reports that when she rests her shortness of breath gets better.  She reports no chest pain or chest pressure with the symptoms.  She reports no palpitations either.  She has no lower extremity edema or symptoms concerning for congestive heart failure.  She describes no orthopnea or PND.  She does have venous insufficiency has undergone several venous ablation studies.  She also reports 1 month of cough which is associated with dysphagia.  She reports at time certain foods like they do not go down correctly.  This did not result in cough.  She can also notice coughing after eating meals.  She has never been diagnosed with acid reflux but symptoms are concerning for this.  She has no history of myocardial infarction or stroke.  Her mother had a history of cancer and heart complications from this.  Her father was killed in 59 War II at age 48 with no significant medical history.  She is a never smoker and does not consume alcohol.  No history of diabetes.  Most recent cholesterol elevated but she takes no medications for this.  Total cholesterol 264, HDL 70, LDL 180, triglycerides 72, serum creatinine 0.71  Past Medical History: Past Medical History:  Diagnosis Date  . Benign head tremor    Idiopathic   . Hypercholesteremia   . Inner ear dysfunction    intermittent  . Internal and external hemorrhoids without complication   . Osteopenia   . Raynaud's syndrome   . Tremor   . Venous insufficiency of both lower extremities     Past Surgical History: Past Surgical History:  Procedure Laterality Date  . ABLATION SAPHENOUS VEIN W/ RFA    . BREAST BIOPSY Left   . CATARACT EXTRACTION W/PHACO Right 05/11/2018   Procedure: CATARACT EXTRACTION PHACO AND INTRAOCULAR LENS PLACEMENT ([place]) RIGHT;  Surgeon: Leandrew Koyanagi, MD;  Location: [place];  Service: Ophthalmology;  Laterality: Right;  . CATARACT EXTRACTION W/PHACO Left 06/01/2018   Procedure: CATARACT EXTRACTION PHACO AND INTRAOCULAR LENS PLACEMENT ([facility]);  Surgeon: Leandrew Koyanagi, MD;  Location: [place];  Service: Ophthalmology;  Laterality: Left;  LEFT  IVA TOPICAL  . HYSTERECTOMEY  1983  . TONSILLECTOMY     age 6  . YV Plasty     vaginal    Current Medications: Current Meds  Medication Sig  . AMBULATORY NON FORMULARY MEDICATION   . Ascorbic Acid (VITAMIN C) 1000 MG tablet Take 1,000 mg by mouth daily.  . B Complex Vitamins (B COMPLEX PO) Take by mouth daily.  Jolyne Loa Grape-Goldenseal (BERBERINE COMPLEX PO) Take 2,000 mg by mouth daily.   . Calcium-Vitamin D-Iron (RA CALCIUM/IRON/VITAMIN D PO) Take by mouth daily.  . Coenzyme Q10 (CO Q  10) 100 MG CAPS Take 1 capsule by mouth daily.  Marland Kitchen estradiol (ESTRACE) 0.1 MG/GM vaginal cream Place 1 Applicatorful vaginally 2 (two) times a week.  Marland Kitchen MAGNESIUM PO Take 250 mg by mouth 2 (two) times daily.  . Multiple Vitamins-Minerals (OCUVITE ADULT 50+) CAPS Take by mouth daily.  . Omega-3 Fatty Acids (FISH OIL BURP-LESS PO) Take 1 Dose by mouth daily.  . propranolol (INDERAL) 20 MG tablet Take 20 mg by mouth 3 (three) times daily. Take 1-2 tablets in the morning and one at night.  . Quercetin 250 MG TABS Take 1 tablet by mouth daily.  Marland Kitchen RESVERATROL PO Take  1 tablet by mouth daily.  . TURMERIC CURCUMIN PO Take 500 mg by mouth daily.   . valACYclovir (VALTREX) 1000 MG tablet Take 1,000 mg by mouth as needed.     Allergies:    Lipitor [atorvastatin] and Sulfonamide derivatives   Social History: Social History   Socioeconomic History  . Marital status: Widowed    Spouse name: Not on file  . Number of children: 3  . Years of education: Not on file  . Highest education level: Not on file  Occupational History  . 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 Smoking status: Never Smoker  . Smokeless tobacco: Never Used  Substance and Sexual Activity  . Alcohol use: No    Alcohol/week: 0.0 standard drinks  . Drug use: No  . Sexual activity: Not on file  Other Topics Concern  . Not on file  Social History Narrative  . Not on file   Social Determinants of Health   Financial Resource Strain:   . Difficulty of Paying Living Expenses: Not on file  Food Insecurity:   . Worried About Charity fundraiser in the Last Year: Not on file  . Ran Out of Food in the Last Year: Not on file  Transportation Needs:   . Lack of Transportation (Medical): Not on file  . Lack of Transportation (Non-Medical): Not on file  Physical Activity:   . Days of Exercise per Week: Not on file  . Minutes of Exercise per Session: Not on file  Stress:   . Feeling of Stress : Not on file  Social Connections:   . Frequency of Communication with Friends and Family: Not on file  . Frequency of Social Gatherings with Friends and Family: Not on file  . Attends Religious Services: Not on file  . Active Member of Clubs or Organizations: Not on file  . Attends Archivist Meetings: Not on file  . Marital Status: Not on file     Family History: The patient's family history includes Atrial fibrillation in her mother; Heart failure in her mother; Hyperlipidemia in her brother; Hypertension in her brother; Leukemia (age of onset: 2) in  her son; Uterine cancer (age of onset: 15) in her mother. There is no history of Colon cancer.  ROS:   All other ROS reviewed and negative. Pertinent positives noted in the HPI.     EKGs/Labs/Other Studies Reviewed:   The following studies were personally reviewed by me today:  EKG:  EKG is ordered today.  The ekg ordered today demonstrates sinus rhythm, heart rate 60, no acute ST-T changes, no evidence of prior infarct, and was personally reviewed by me.   Recent Labs: No results found for requested labs within last 8760 hours.   Recent Lipid Panel No results found for: CHOL, TRIG, HDL,  CHOLHDL, VLDL, LDLCALC, LDLDIRECT  Physical Exam:   VS:  BP 140/76   Pulse 60   Ht 5\' 5"  (1.651 m)   Wt 163 lb 3.2 oz (74 kg)   BMI 27.16 kg/m    Wt Readings from Last 3 Encounters:  11/16/19 163 lb 3.2 oz (74 kg)  06/01/18 154 lb (69.9 kg)  05/11/18 156 lb (70.8 kg)    General: Well nourished, well developed, in no acute distress Heart: Atraumatic, normal size  Eyes: PEERLA, EOMI  Neck: Supple, no JVD Endocrine: No thryomegaly Cardiac: Normal S1, S2; 2 out of 6 holosystolic murmur Lungs: Clear to auscultation bilaterally, no wheezing, rhonchi or rales  Abd: Soft, nontender, no hepatomegaly  Ext: No edema, pulses 2+ Musculoskeletal: No deformities, BUE and BLE strength normal and equal Skin: Warm and dry, no rashes   Neuro: Alert and oriented to person, place, time, and situation, CNII-XII grossly intact, no focal deficits  Psych: Normal mood and affect   ASSESSMENT:   Natacha Ideker is a 79 y.o. female who presents for the following: 1. SOB (shortness of breath) on exertion   2. Essential hypertension   3. Cough   4. Fatigue, unspecified type   5. Murmur     PLAN:   1. SOB (shortness of breath) on exertion -3 to 5 months of exertional shortness of breath.  EKG normal without any acute changes.  We will check TSH, BNP, echocardiogram today.  She does have a faint  holosystolic murmur 2 out of 6 on examination.  No stigmata of heart failure on examination. -No overt chest pain but does report some occasional chest tightness.  If the above work-up is negative we will likely pursue cardiac CTA.  She was in agreement this plan.  2. Essential hypertension -Blood pressure okay today no changes to medications.  She takes propranolol for tremor.  3. Cough -Appears to have cough and dysphagia symptoms.  I am concerned for acid reflux.  She will start Prilosec 20 mg over-the-counter.  We will reevaluate symptoms in 1 month and likely refer her to gastroenterology if symptoms or not improving.   Disposition: Return in about 1 month (around 12/14/2019).  Medication Adjustments/Labs and Tests Ordered: Current medicines are reviewed at length with the patient today.  Concerns regarding medicines are outlined above.  Orders Placed This Encounter  Procedures  . Brain natriuretic peptide  . TSH  . EKG 12-Lead  . ECHOCARDIOGRAM COMPLETE   No orders of the defined types were placed in this encounter.   Patient Instructions  Medication Instructions:  The current medical regimen is effective;  continue present plan and medications.  *If you need a refill on your cardiac medications before your next appointment, please call your pharmacy*  Lab Work: BNP, TSH today  If you have labs (blood work) drawn today and your tests are completely normal, you will receive your results only by: Marland Kitchen MyChart Message (if you have MyChart) OR . A paper copy in the mail If you have any lab test that is abnormal or we need to change your treatment, we will call you to review the results.  Testing/Procedures: Echocardiogram - Your physician has requested that you have an echocardiogram. Echocardiography is a painless test that uses sound waves to create images of your heart. It provides your doctor with information about the size and shape of your heart and how well your heart's  chambers and valves are working. This procedure takes approximately one hour. There are  no restrictions for this procedure. This will be performed at our [facility] location - [address], [address].   Follow-Up: At [facility], you and your health needs are our priority.  As part of our continuing mission to provide you with exceptional heart care, we have created designated Provider Care Teams.  These Care Teams include your primary Cardiologist (physician) and Advanced Practice Providers (APPs -  Physician Assistants and Nurse Practitioners) who all work together to provide you with the care you need, when you need it.  Your next appointment:   1 month(s)  The format for your next appointment:   Virtual Visit   Provider:   Eleonore Chiquito, MD        Signed, Addison Naegeli. Audie Box, [place]  [address], [place], [place] [address] [phone]  11/16/2019 9:33 AM

## 2019-11-16 ENCOUNTER — Other Ambulatory Visit: Payer: Self-pay

## 2019-11-16 ENCOUNTER — Encounter: Payer: Self-pay | Admitting: Cardiovascular Disease

## 2019-11-16 ENCOUNTER — Ambulatory Visit (INDEPENDENT_AMBULATORY_CARE_PROVIDER_SITE_OTHER): Payer: Medicare Other | Admitting: Cardiovascular Disease

## 2019-11-16 VITALS — BP 140/76 | HR 60 | Ht 65.0 in | Wt 163.2 lb

## 2019-11-16 DIAGNOSIS — I10 Essential (primary) hypertension: Secondary | ICD-10-CM | POA: Diagnosis not present

## 2019-11-16 DIAGNOSIS — R011 Cardiac murmur, unspecified: Secondary | ICD-10-CM

## 2019-11-16 DIAGNOSIS — R05 Cough: Secondary | ICD-10-CM | POA: Diagnosis not present

## 2019-11-16 DIAGNOSIS — R0602 Shortness of breath: Secondary | ICD-10-CM

## 2019-11-16 DIAGNOSIS — R059 Cough, unspecified: Secondary | ICD-10-CM

## 2019-11-16 DIAGNOSIS — R5383 Other fatigue: Secondary | ICD-10-CM | POA: Diagnosis not present

## 2019-11-16 NOTE — Patient Instructions (Signed)
Medication Instructions:  The current medical regimen is effective;  continue present plan and medications.  *If you need a refill on your cardiac medications before your next appointment, please call your pharmacy*  Lab Work: BNP, TSH today  If you have labs (blood work) drawn today and your tests are completely normal, you will receive your results only by: Marland Kitchen MyChart Message (if you have MyChart) OR . A paper copy in the mail If you have any lab test that is abnormal or we need to change your treatment, we will call you to review the results.  Testing/Procedures: Echocardiogram - Your physician has requested that you have an echocardiogram. Echocardiography is a painless test that uses sound waves to create images of your heart. It provides your doctor with information about the size and shape of your heart and how well your heart's chambers and valves are working. This procedure takes approximately one hour. There are no restrictions for this procedure. This will be performed at our [facility] location - [address], [address].   Follow-Up: At [facility], you and your health needs are our priority.  As part of our continuing mission to provide you with exceptional heart care, we have created designated Provider Care Teams.  These Care Teams include your primary Cardiologist (physician) and Advanced Practice Providers (APPs -  Physician Assistants and Nurse Practitioners) who all work together to provide you with the care you need, when you need it.  Your next appointment:   1 month(s)  The format for your next appointment:   Virtual Visit   Provider:   Eleonore Chiquito, MD

## 2019-11-17 LAB — TSH: TSH: 3.96 u[IU]/mL (ref 0.450–4.500)

## 2019-11-17 LAB — BRAIN NATRIURETIC PEPTIDE: BNP: 39.7 pg/mL (ref 0.0–100.0)

## 2019-12-01 ENCOUNTER — Other Ambulatory Visit: Payer: Self-pay

## 2019-12-01 ENCOUNTER — Ambulatory Visit (HOSPITAL_COMMUNITY): Payer: Medicare Other | Attending: Internal Medicine

## 2019-12-01 DIAGNOSIS — E785 Hyperlipidemia, unspecified: Secondary | ICD-10-CM | POA: Diagnosis not present

## 2019-12-01 DIAGNOSIS — I351 Nonrheumatic aortic (valve) insufficiency: Secondary | ICD-10-CM | POA: Insufficient documentation

## 2019-12-01 DIAGNOSIS — R0602 Shortness of breath: Secondary | ICD-10-CM | POA: Diagnosis present

## 2019-12-14 NOTE — Progress Notes (Signed)
Virtual Visit via Video Note   This visit type was conducted due to national recommendations for restrictions regarding the COVID-19 Pandemic (e.g. social distancing) in an effort to limit this patient's exposure and mitigate transmission in our community.  Due to her co-morbid illnesses, this patient is at least at moderate risk for complications without adequate follow up.  This format is felt to be most appropriate for this patient at this time.  All issues noted in this document were discussed and addressed.  A limited physical exam was performed with this format.  Please refer to the patient's chart for her consent to telehealth for [facility].   The patient was identified using 2 identifiers.  Date:  12/15/2019   ID:  Crystal Thomas June 12, 1941, MRN [id number]  Patient Location: Home Provider Location: Office  PCP:  Leighton Ruff, MD  Cardiologist:  No primary care provider on file.   Evaluation Performed:  Follow-Up Visit  Chief Complaint:  SOB  History of Present Illness:    Crystal Thomas is a 79 y.o. female with a hx of HTN who presents for follow-up of SOB. BNP 39. TSH 3.9. She reports she is doing much better since our last visit. She is not getting SOB with exertion. She reports that not wearing a mask has helped. She reports no CP or palpitations. She is relieved to know her cardiac testing has been normal. She denies any symptoms today.   The patient does not have symptoms concerning for COVID-19 infection (fever, chills, cough, or new shortness of breath).    Past Medical History:  Diagnosis Date  . Benign head tremor    Idiopathic  . Hypercholesteremia   . Inner ear dysfunction    intermittent  . Internal and external hemorrhoids without complication   . Osteopenia   . Raynaud's syndrome   . Tremor   . Venous insufficiency of both lower extremities    Past Surgical History:  Procedure Laterality Date  . ABLATION  SAPHENOUS VEIN W/ RFA    . BREAST BIOPSY Left   . CATARACT EXTRACTION W/PHACO Right 05/11/2018   Procedure: CATARACT EXTRACTION PHACO AND INTRAOCULAR LENS PLACEMENT ([place]) RIGHT;  Surgeon: Leandrew Koyanagi, MD;  Location: [place];  Service: Ophthalmology;  Laterality: Right;  . CATARACT EXTRACTION W/PHACO Left 06/01/2018   Procedure: CATARACT EXTRACTION PHACO AND INTRAOCULAR LENS PLACEMENT ([facility]);  Surgeon: Leandrew Koyanagi, MD;  Location: [place];  Service: Ophthalmology;  Laterality: Left;  LEFT  IVA TOPICAL  . HYSTERECTOMEY  1983  . TONSILLECTOMY     age 47  . YV Plasty     vaginal     Current Meds  Medication Sig  . AMBULATORY NON FORMULARY MEDICATION   . Ascorbic Acid (VITAMIN C) 1000 MG tablet Take 1,000 mg by mouth daily.  . B Complex Vitamins (B COMPLEX PO) Take by mouth daily.  Jolyne Loa Grape-Goldenseal (BERBERINE COMPLEX PO) Take 2,000 mg by mouth daily.   . Calcium-Vitamin D-Iron (RA CALCIUM/IRON/VITAMIN D PO) Take by mouth daily.  . Coenzyme Q10 (CO Q 10) 100 MG CAPS Take 1 capsule by mouth daily.  Marland Kitchen estradiol (ESTRACE) 0.1 MG/GM vaginal cream Place 1 Applicatorful vaginally 2 (two) times a week.  Marland Kitchen MAGNESIUM PO Take 250 mg by mouth 2 (two) times daily.  . Multiple Vitamins-Minerals (OCUVITE ADULT 50+) CAPS Take by mouth daily.  . Omega-3 Fatty Acids (FISH OIL BURP-LESS PO) Take 1 Dose by mouth daily.  . propranolol (INDERAL) 20  MG tablet Take 20 mg by mouth 3 (three) times daily. Take 1-2 tablets in the morning and one at night.  . Quercetin 250 MG TABS Take 1 tablet by mouth daily.  Marland Kitchen RESVERATROL PO Take 1 tablet by mouth daily.  . TURMERIC CURCUMIN PO Take 500 mg by mouth daily.   . valACYclovir (VALTREX) 1000 MG tablet Take 1,000 mg by mouth as needed.     Allergies:   Lipitor [atorvastatin] and Sulfonamide derivatives   Social History   Tobacco Use  . Smoking status: Never Smoker  . Smokeless tobacco: Never Used  Substance Use  Topics  . Alcohol use: No    Alcohol/week: 0.0 standard drinks  . Drug use: No     Family Hx: The patient's family history includes Atrial fibrillation in her mother; Heart failure in her mother; Hyperlipidemia in her brother; Hypertension in her brother; Leukemia (age of onset: 2) in her son; Uterine cancer (age of onset: 71) in her mother. There is no history of Colon cancer.  ROS:   Please see the history of present illness.     All other systems reviewed and are negative.   Prior CV studies:   The following studies were reviewed today:  TTE 12/01/2019 1. Left ventricular ejection fraction, by estimation, is 60 to 65%. The  left ventricle has normal function. The left ventricle has no regional  wall motion abnormalities. Left ventricular diastolic parameters were  normal.  2. Right ventricular systolic function is normal. The right ventricular  size is normal. Tricuspid regurgitation signal is inadequate for assessing  PA pressure.  3. The mitral valve is normal in structure and function. No evidence of  mitral valve regurgitation. No evidence of mitral stenosis.  4. The aortic valve is normal in structure and function. Aortic valve  regurgitation is trivial. No aortic stenosis is present.  5. The inferior vena cava is normal in size with greater than 50%  respiratory variability, suggesting right atrial pressure of 3 mmHg.   Labs/Other Tests and Data Reviewed:    EKG:  No ECG reviewed.  Recent Labs: 11/16/2019: BNP 39.7; TSH 3.960   Recent Lipid Panel No results found for: CHOL, TRIG, HDL, CHOLHDL, LDLCALC, LDLDIRECT  Wt Readings from Last 3 Encounters:  12/15/19 161 lb (73 kg)  11/16/19 163 lb 3.2 oz (74 kg)  06/01/18 154 lb (69.9 kg)     Objective:    Vital Signs:  Ht 5\' 5"  (1.651 m)   Wt 161 lb (73 kg)   BMI 26.79 kg/m    VITAL SIGNS:  reviewed  Gen: NAD Pulm: No SOB on video Psych: normal mood/affect   ASSESSMENT & PLAN:    1. SOB (shortness  of breath) on exertion -Normal BNP, normal TSH, normal echocardiogram.  No significant valvular heart disease.  Symptoms appear to be improved.  Apparently wearing a mask was making her symptoms worse.  Since she is not wearing her mask and social distancing more, she has noted improvement in her symptoms.  I see no need for further cardiac work-up at this time.  We will see her on as-needed basis.  2. Essential hypertension -She will continue home blood pressure medications   COVID-19 Education: The signs and symptoms of COVID-19 were discussed with the patient and how to seek care for testing (follow up with PCP or arrange E-visit).  The importance of social distancing was discussed today.  Time:   Today, I have spent 25 minutes with the patient with  telehealth technology discussing the above problems.     Medication Adjustments/Labs and Tests Ordered: Current medicines are reviewed at length with the patient today.  Concerns regarding medicines are outlined above.   Tests Ordered: No orders of the defined types were placed in this encounter.   Medication Changes: No orders of the defined types were placed in this encounter.   Follow Up:  PRN  Signed, Evalina Field, MD  12/15/2019 9:21 AM    [place]

## 2019-12-15 ENCOUNTER — Encounter: Payer: Self-pay | Admitting: Cardiovascular Disease

## 2019-12-15 ENCOUNTER — Telehealth (INDEPENDENT_AMBULATORY_CARE_PROVIDER_SITE_OTHER): Payer: Medicare Other | Admitting: Cardiovascular Disease

## 2019-12-15 VITALS — Ht 65.0 in | Wt 161.0 lb

## 2019-12-15 DIAGNOSIS — R0602 Shortness of breath: Secondary | ICD-10-CM | POA: Diagnosis not present

## 2019-12-15 DIAGNOSIS — I10 Essential (primary) hypertension: Secondary | ICD-10-CM

## 2019-12-15 NOTE — Patient Instructions (Signed)
Medication Instructions:  The current medical regimen is effective;  continue present plan and medications.  *If you need a refill on your cardiac medications before your next appointment, please call your pharmacy*   Follow-Up: At [facility], you and your health needs are our priority.  As part of our continuing mission to provide you with exceptional heart care, we have created designated Provider Care Teams.  These Care Teams include your primary Cardiologist (physician) and Advanced Practice Providers (APPs -  Physician Assistants and Nurse Practitioners) who all work together to provide you with the care you need, when you need it.  We recommend signing up for the patient portal called "MyChart".  Sign up information is provided on this After Visit Summary.  MyChart is used to connect with patients for Virtual Visits (Telemedicine).  Patients are able to view lab/test results, encounter notes, upcoming appointments, etc.  Non-urgent messages can be sent to your provider as well.   To learn more about what you can do with MyChart, go to [email or web address].    Your next appointment:   As needed  The format for your next appointment:   Either In Person or Virtual  Provider:   Nyssa O'Neal, MD      

## 2020-11-08 DIAGNOSIS — I87323 Chronic venous hypertension (idiopathic) with inflammation of bilateral lower extremity: Secondary | ICD-10-CM | POA: Diagnosis not present

## 2021-04-11 DIAGNOSIS — E7800 Pure hypercholesterolemia, unspecified: Secondary | ICD-10-CM | POA: Diagnosis not present

## 2021-04-11 DIAGNOSIS — G250 Essential tremor: Secondary | ICD-10-CM | POA: Diagnosis not present

## 2021-04-11 DIAGNOSIS — M8588 Other specified disorders of bone density and structure, other site: Secondary | ICD-10-CM | POA: Diagnosis not present

## 2021-04-11 DIAGNOSIS — Z0000 Encounter for general adult medical examination without abnormal findings: Secondary | ICD-10-CM | POA: Diagnosis not present

## 2021-04-16 ENCOUNTER — Other Ambulatory Visit: Payer: Self-pay | Admitting: Family Medicine

## 2021-04-16 DIAGNOSIS — M8580 Other specified disorders of bone density and structure, unspecified site: Secondary | ICD-10-CM

## 2021-04-18 DIAGNOSIS — G250 Essential tremor: Secondary | ICD-10-CM | POA: Diagnosis not present

## 2021-04-18 DIAGNOSIS — M8588 Other specified disorders of bone density and structure, other site: Secondary | ICD-10-CM | POA: Diagnosis not present

## 2021-04-18 DIAGNOSIS — Z0000 Encounter for general adult medical examination without abnormal findings: Secondary | ICD-10-CM | POA: Diagnosis not present

## 2021-04-18 DIAGNOSIS — E7800 Pure hypercholesterolemia, unspecified: Secondary | ICD-10-CM | POA: Diagnosis not present

## 2021-05-08 ENCOUNTER — Other Ambulatory Visit: Payer: Self-pay | Admitting: Family Medicine

## 2021-05-08 DIAGNOSIS — Z1231 Encounter for screening mammogram for malignant neoplasm of breast: Secondary | ICD-10-CM

## 2021-06-27 ENCOUNTER — Ambulatory Visit
Admission: RE | Admit: 2021-06-27 | Discharge: 2021-06-27 | Disposition: A | Discharge status: Home / Self Care | Payer: Medicare Other | Source: Ambulatory Visit | Attending: Family Medicine | Admitting: Family Medicine

## 2021-06-27 ENCOUNTER — Other Ambulatory Visit: Payer: Self-pay

## 2021-06-27 DIAGNOSIS — Z1231 Encounter for screening mammogram for malignant neoplasm of breast: Secondary | ICD-10-CM | POA: Diagnosis not present

## 2021-06-27 DIAGNOSIS — H43813 Vitreous degeneration, bilateral: Secondary | ICD-10-CM | POA: Diagnosis not present

## 2021-09-20 DIAGNOSIS — U071 COVID-19: Secondary | ICD-10-CM | POA: Diagnosis not present

## 2021-10-02 DIAGNOSIS — U071 COVID-19: Secondary | ICD-10-CM | POA: Diagnosis not present

## 2021-10-31 ENCOUNTER — Ambulatory Visit
Admission: RE | Admit: 2021-10-31 | Discharge: 2021-10-31 | Disposition: A | Discharge status: Home / Self Care | Payer: Medicare Other | Source: Ambulatory Visit | Attending: Family Medicine | Admitting: Family Medicine

## 2021-10-31 DIAGNOSIS — Z780 Asymptomatic menopausal state: Secondary | ICD-10-CM | POA: Diagnosis not present

## 2021-10-31 DIAGNOSIS — M8580 Other specified disorders of bone density and structure, unspecified site: Secondary | ICD-10-CM

## 2021-10-31 DIAGNOSIS — M8589 Other specified disorders of bone density and structure, multiple sites: Secondary | ICD-10-CM | POA: Diagnosis not present

## 2021-10-31 DIAGNOSIS — M810 Age-related osteoporosis without current pathological fracture: Secondary | ICD-10-CM | POA: Diagnosis not present

## 2021-11-07 DIAGNOSIS — M810 Age-related osteoporosis without current pathological fracture: Secondary | ICD-10-CM | POA: Diagnosis not present

## 2021-11-07 DIAGNOSIS — I7300 Raynaud's syndrome without gangrene: Secondary | ICD-10-CM | POA: Diagnosis not present

## 2021-11-07 DIAGNOSIS — M519 Unspecified thoracic, thoracolumbar and lumbosacral intervertebral disc disorder: Secondary | ICD-10-CM | POA: Diagnosis not present

## 2021-11-10 ENCOUNTER — Other Ambulatory Visit: Payer: Self-pay | Admitting: Family Medicine

## 2021-11-10 ENCOUNTER — Ambulatory Visit
Admission: RE | Admit: 2021-11-10 | Discharge: 2021-11-10 | Disposition: A | Discharge status: Home / Self Care | Payer: Medicare Other | Source: Ambulatory Visit | Attending: Family Medicine | Admitting: Family Medicine

## 2021-11-10 ENCOUNTER — Other Ambulatory Visit: Payer: Self-pay

## 2021-11-10 DIAGNOSIS — M549 Dorsalgia, unspecified: Secondary | ICD-10-CM

## 2021-11-10 DIAGNOSIS — M545 Low back pain, unspecified: Secondary | ICD-10-CM | POA: Diagnosis not present

## 2021-11-10 DIAGNOSIS — M533 Sacrococcygeal disorders, not elsewhere classified: Secondary | ICD-10-CM | POA: Diagnosis not present

## 2022-02-20 DIAGNOSIS — M545 Low back pain, unspecified: Secondary | ICD-10-CM | POA: Diagnosis not present

## 2022-02-20 DIAGNOSIS — M519 Unspecified thoracic, thoracolumbar and lumbosacral intervertebral disc disorder: Secondary | ICD-10-CM | POA: Diagnosis not present

## 2022-02-20 DIAGNOSIS — G8929 Other chronic pain: Secondary | ICD-10-CM | POA: Diagnosis not present

## 2022-02-20 DIAGNOSIS — M810 Age-related osteoporosis without current pathological fracture: Secondary | ICD-10-CM | POA: Diagnosis not present

## 2022-03-09 DIAGNOSIS — S39012A Strain of muscle, fascia and tendon of lower back, initial encounter: Secondary | ICD-10-CM | POA: Diagnosis not present

## 2022-03-16 DIAGNOSIS — S335XXD Sprain of ligaments of lumbar spine, subsequent encounter: Secondary | ICD-10-CM | POA: Diagnosis not present

## 2022-03-16 DIAGNOSIS — M545 Low back pain, unspecified: Secondary | ICD-10-CM | POA: Diagnosis not present

## 2022-03-20 DIAGNOSIS — M545 Low back pain, unspecified: Secondary | ICD-10-CM | POA: Diagnosis not present

## 2022-03-20 DIAGNOSIS — S335XXD Sprain of ligaments of lumbar spine, subsequent encounter: Secondary | ICD-10-CM | POA: Diagnosis not present

## 2022-03-23 DIAGNOSIS — M545 Low back pain, unspecified: Secondary | ICD-10-CM | POA: Diagnosis not present

## 2022-03-23 DIAGNOSIS — S335XXD Sprain of ligaments of lumbar spine, subsequent encounter: Secondary | ICD-10-CM | POA: Diagnosis not present

## 2022-03-27 DIAGNOSIS — S335XXD Sprain of ligaments of lumbar spine, subsequent encounter: Secondary | ICD-10-CM | POA: Diagnosis not present

## 2022-03-27 DIAGNOSIS — M545 Low back pain, unspecified: Secondary | ICD-10-CM | POA: Diagnosis not present

## 2022-04-03 DIAGNOSIS — M545 Low back pain, unspecified: Secondary | ICD-10-CM | POA: Diagnosis not present

## 2022-04-03 DIAGNOSIS — S335XXD Sprain of ligaments of lumbar spine, subsequent encounter: Secondary | ICD-10-CM | POA: Diagnosis not present

## 2022-04-06 DIAGNOSIS — M545 Low back pain, unspecified: Secondary | ICD-10-CM | POA: Diagnosis not present

## 2022-04-06 DIAGNOSIS — S335XXD Sprain of ligaments of lumbar spine, subsequent encounter: Secondary | ICD-10-CM | POA: Diagnosis not present

## 2022-04-09 DIAGNOSIS — M545 Low back pain, unspecified: Secondary | ICD-10-CM | POA: Diagnosis not present

## 2022-04-09 DIAGNOSIS — S335XXD Sprain of ligaments of lumbar spine, subsequent encounter: Secondary | ICD-10-CM | POA: Diagnosis not present

## 2022-04-13 DIAGNOSIS — S335XXD Sprain of ligaments of lumbar spine, subsequent encounter: Secondary | ICD-10-CM | POA: Diagnosis not present

## 2022-04-13 DIAGNOSIS — M545 Low back pain, unspecified: Secondary | ICD-10-CM | POA: Diagnosis not present

## 2022-04-17 DIAGNOSIS — S335XXD Sprain of ligaments of lumbar spine, subsequent encounter: Secondary | ICD-10-CM | POA: Diagnosis not present

## 2022-04-17 DIAGNOSIS — M545 Low back pain, unspecified: Secondary | ICD-10-CM | POA: Diagnosis not present

## 2022-04-20 DIAGNOSIS — M545 Low back pain, unspecified: Secondary | ICD-10-CM | POA: Diagnosis not present

## 2022-04-20 DIAGNOSIS — S335XXD Sprain of ligaments of lumbar spine, subsequent encounter: Secondary | ICD-10-CM | POA: Diagnosis not present

## 2022-05-01 DIAGNOSIS — M545 Low back pain, unspecified: Secondary | ICD-10-CM | POA: Diagnosis not present

## 2022-05-01 DIAGNOSIS — S335XXD Sprain of ligaments of lumbar spine, subsequent encounter: Secondary | ICD-10-CM | POA: Diagnosis not present

## 2022-05-01 DIAGNOSIS — S39012A Strain of muscle, fascia and tendon of lower back, initial encounter: Secondary | ICD-10-CM | POA: Diagnosis not present

## 2022-05-01 DIAGNOSIS — M5136 Other intervertebral disc degeneration, lumbar region: Secondary | ICD-10-CM | POA: Diagnosis not present

## 2022-05-07 DIAGNOSIS — M545 Low back pain, unspecified: Secondary | ICD-10-CM | POA: Diagnosis not present

## 2022-05-07 DIAGNOSIS — S335XXD Sprain of ligaments of lumbar spine, subsequent encounter: Secondary | ICD-10-CM | POA: Diagnosis not present

## 2022-05-11 DIAGNOSIS — M545 Low back pain, unspecified: Secondary | ICD-10-CM | POA: Diagnosis not present

## 2022-05-11 DIAGNOSIS — S335XXD Sprain of ligaments of lumbar spine, subsequent encounter: Secondary | ICD-10-CM | POA: Diagnosis not present

## 2022-05-13 DIAGNOSIS — M5136 Other intervertebral disc degeneration, lumbar region: Secondary | ICD-10-CM | POA: Diagnosis not present

## 2022-05-13 DIAGNOSIS — M545 Low back pain, unspecified: Secondary | ICD-10-CM | POA: Diagnosis not present

## 2022-05-13 DIAGNOSIS — S335XXD Sprain of ligaments of lumbar spine, subsequent encounter: Secondary | ICD-10-CM | POA: Diagnosis not present

## 2022-05-13 DIAGNOSIS — M5451 Vertebrogenic low back pain: Secondary | ICD-10-CM | POA: Diagnosis not present

## 2022-05-18 DIAGNOSIS — M545 Low back pain, unspecified: Secondary | ICD-10-CM | POA: Diagnosis not present

## 2022-05-18 DIAGNOSIS — S335XXD Sprain of ligaments of lumbar spine, subsequent encounter: Secondary | ICD-10-CM | POA: Diagnosis not present

## 2022-05-30 IMAGING — CR DG LUMBAR SPINE 2-3V
3 series · 3 of 3 positions shown · non-contrast
Comparison: None.

CLINICAL DATA: Recent fall several weeks ago with low back pain,
initial encounter

EXAM:
LUMBAR SPINE - 3 VIEW

[t lumbar spine ap]
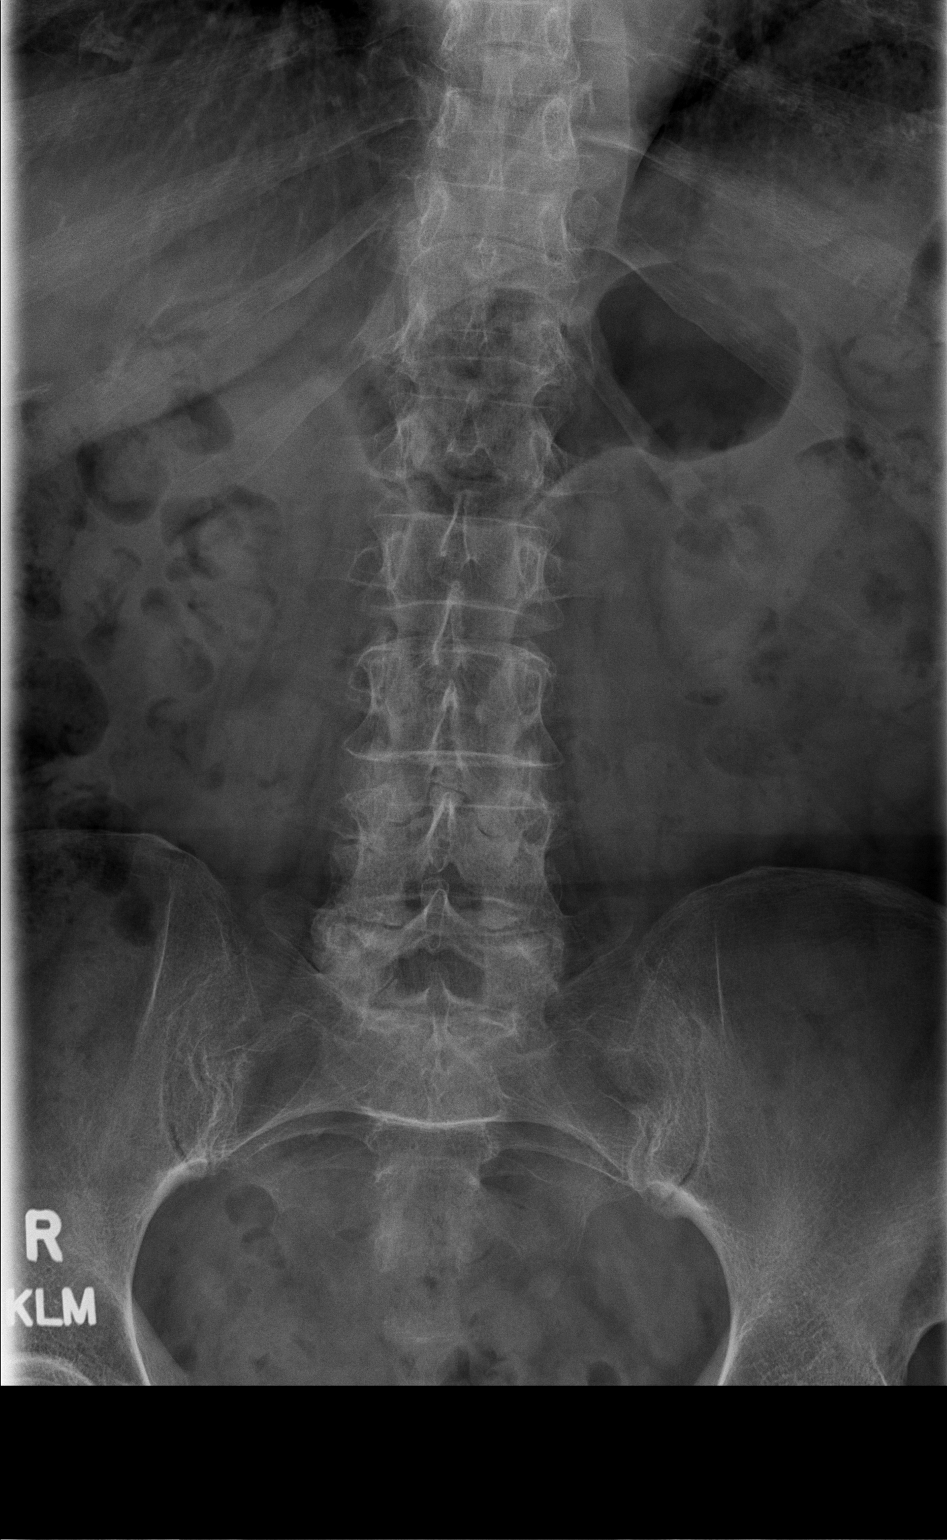

[t lumbar spine lat]
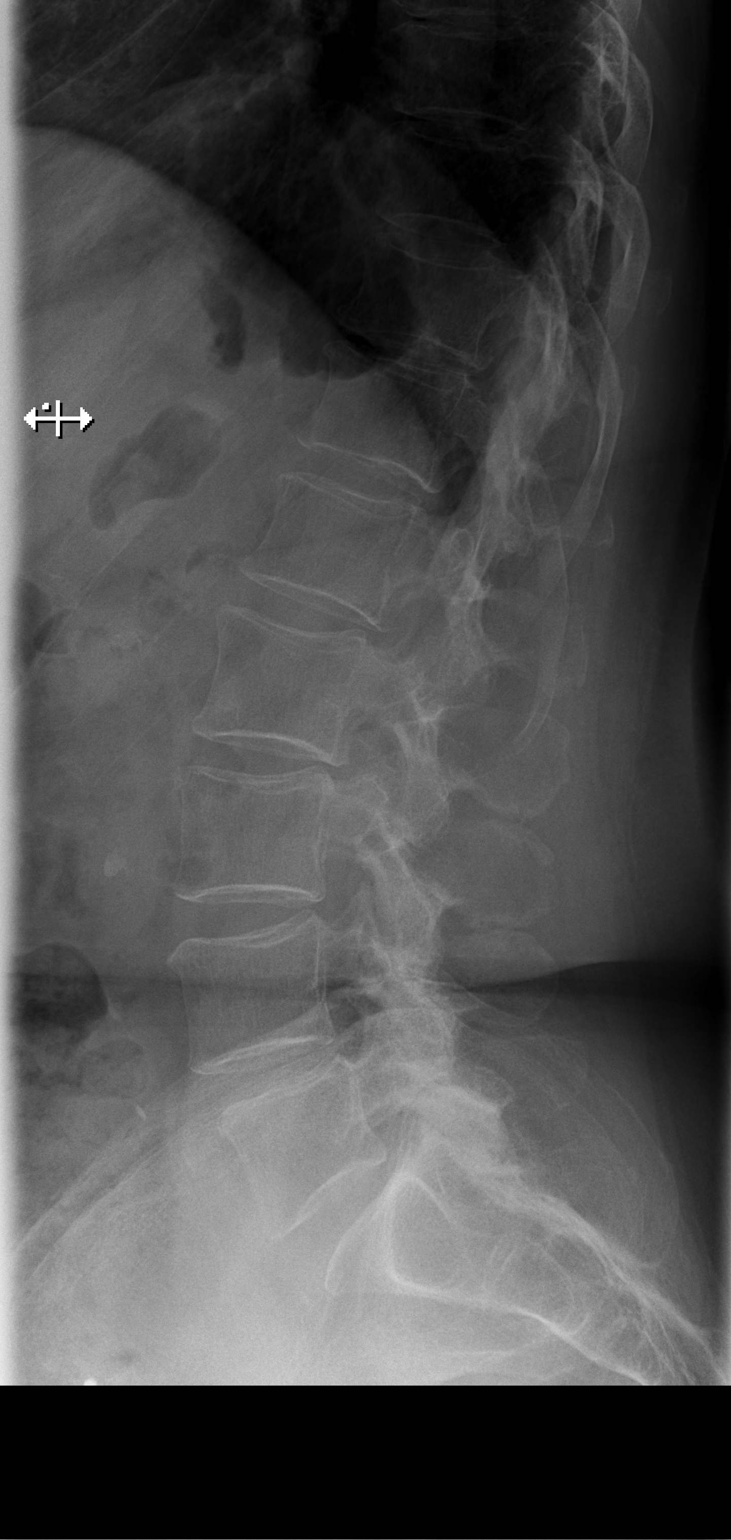

[t lumbar l-5 s-1 spot]
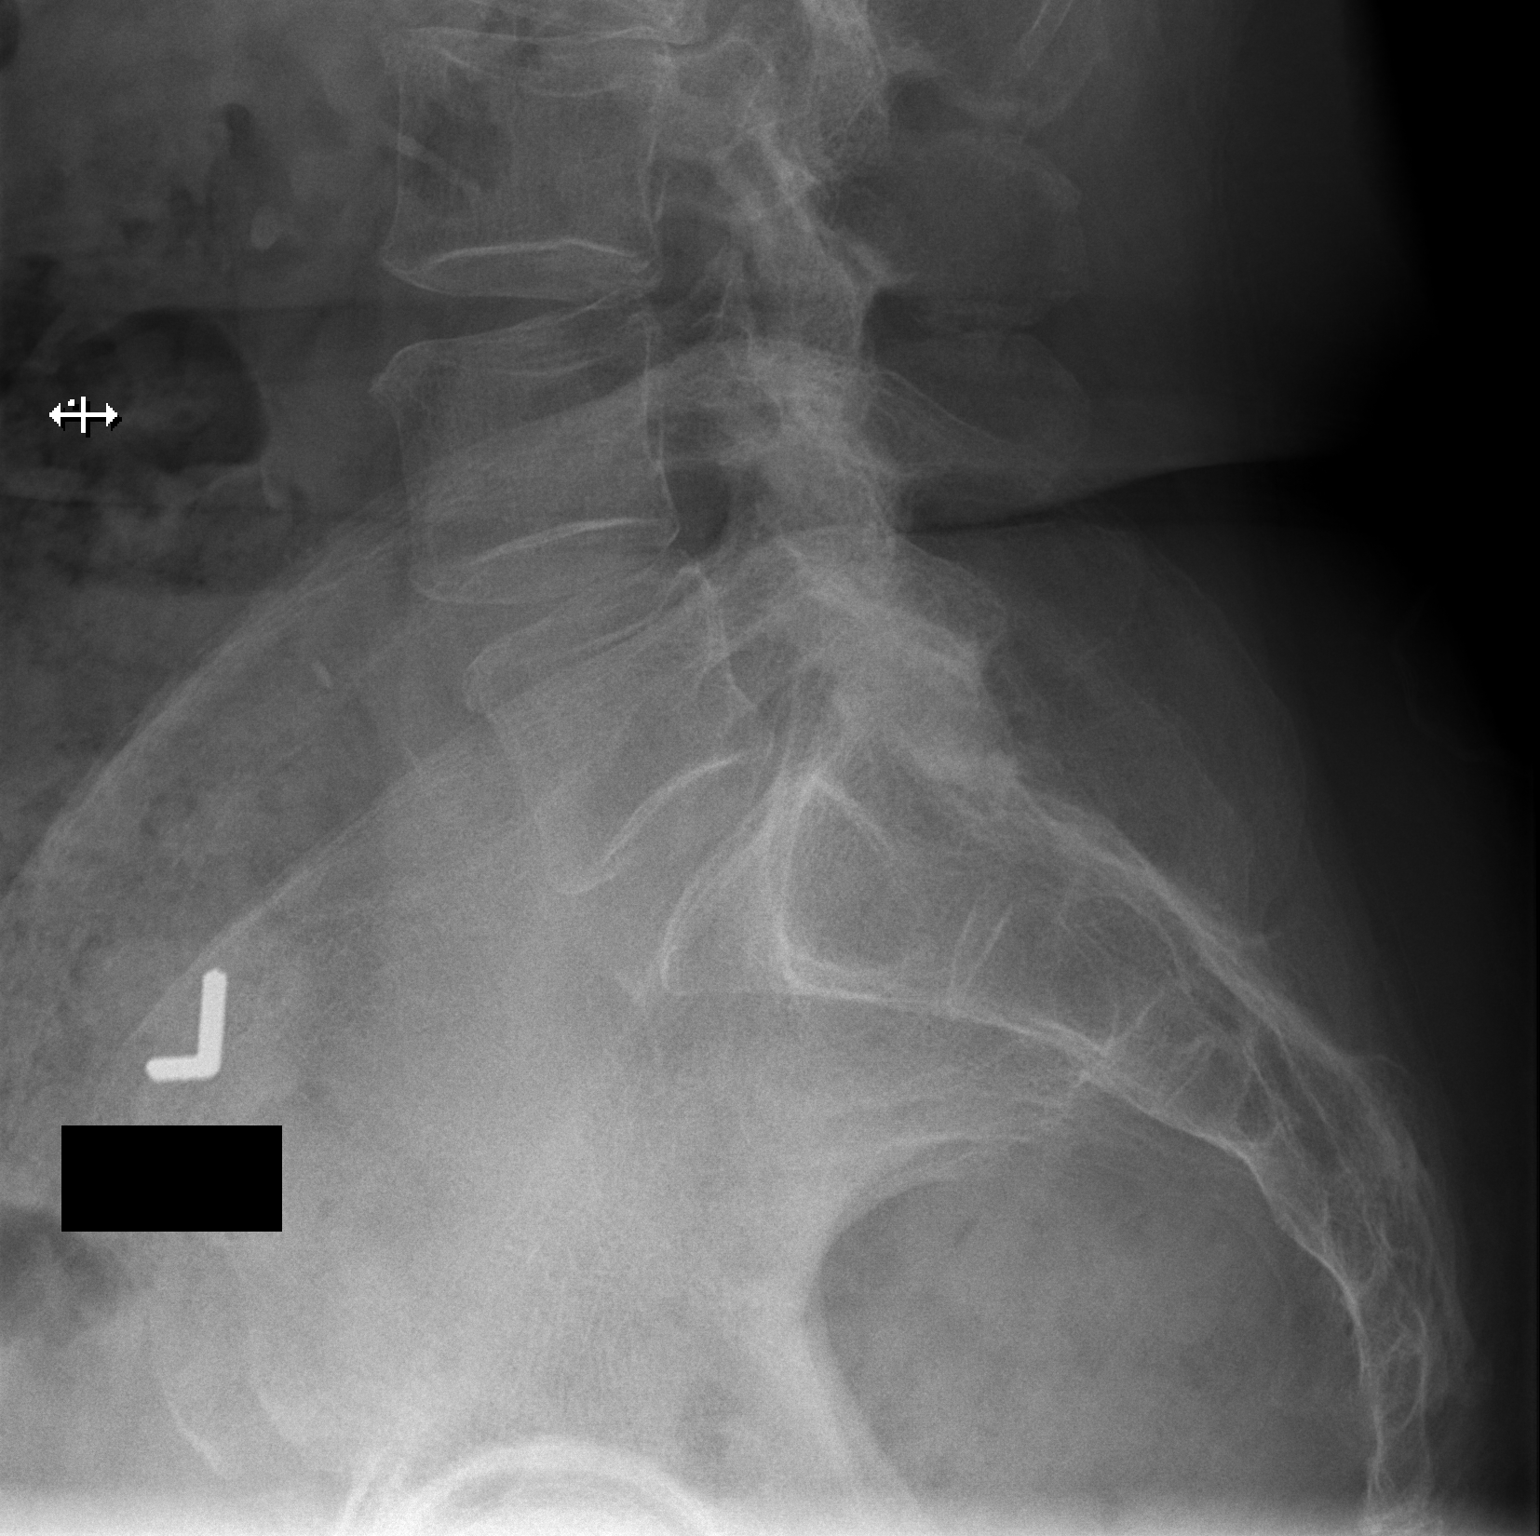

[3 of 3 positions shown; findings below may reference images not displayed]

FINDINGS: Five lumbar type vertebral bodies are well visualized. Vertebral
body height is well maintained. Mild osteophytic changes are seen.
Disc space narrowing is noted at L4-5. Facet hypertrophic changes
are seen. No acute bony abnormality is noted.
IMPRESSION: Mild degenerative change without acute abnormality.

## 2022-07-03 DIAGNOSIS — H43813 Vitreous degeneration, bilateral: Secondary | ICD-10-CM | POA: Diagnosis not present

## 2022-07-08 DIAGNOSIS — B0089 Other herpesviral infection: Secondary | ICD-10-CM | POA: Diagnosis not present

## 2022-07-08 DIAGNOSIS — L814 Other melanin hyperpigmentation: Secondary | ICD-10-CM | POA: Diagnosis not present

## 2022-07-08 DIAGNOSIS — L821 Other seborrheic keratosis: Secondary | ICD-10-CM | POA: Diagnosis not present

## 2022-07-08 DIAGNOSIS — D1801 Hemangioma of skin and subcutaneous tissue: Secondary | ICD-10-CM | POA: Diagnosis not present

## 2022-07-29 DIAGNOSIS — Z23 Encounter for immunization: Secondary | ICD-10-CM | POA: Diagnosis not present

## 2022-07-29 DIAGNOSIS — M519 Unspecified thoracic, thoracolumbar and lumbosacral intervertebral disc disorder: Secondary | ICD-10-CM | POA: Diagnosis not present

## 2022-07-29 DIAGNOSIS — M810 Age-related osteoporosis without current pathological fracture: Secondary | ICD-10-CM | POA: Diagnosis not present

## 2022-07-29 DIAGNOSIS — M545 Low back pain, unspecified: Secondary | ICD-10-CM | POA: Diagnosis not present

## 2022-07-29 DIAGNOSIS — Z6828 Body mass index (BMI) 28.0-28.9, adult: Secondary | ICD-10-CM | POA: Diagnosis not present

## 2022-07-30 ENCOUNTER — Other Ambulatory Visit: Payer: Self-pay | Admitting: Family Medicine

## 2022-07-30 DIAGNOSIS — M545 Low back pain, unspecified: Secondary | ICD-10-CM

## 2022-08-03 ENCOUNTER — Other Ambulatory Visit: Payer: Self-pay | Admitting: Family Medicine

## 2022-08-03 DIAGNOSIS — Z1231 Encounter for screening mammogram for malignant neoplasm of breast: Secondary | ICD-10-CM

## 2022-08-05 ENCOUNTER — Ambulatory Visit
Admission: RE | Admit: 2022-08-05 | Discharge: 2022-08-05 | Disposition: A | Discharge status: Home / Self Care | Payer: Medicare Other | Source: Ambulatory Visit | Attending: Family Medicine | Admitting: Family Medicine

## 2022-08-05 DIAGNOSIS — M545 Low back pain, unspecified: Secondary | ICD-10-CM | POA: Diagnosis not present

## 2022-08-31 ENCOUNTER — Ambulatory Visit: Payer: Medicare Other

## 2022-09-09 DIAGNOSIS — I872 Venous insufficiency (chronic) (peripheral): Secondary | ICD-10-CM | POA: Diagnosis not present

## 2022-09-09 DIAGNOSIS — I83812 Varicose veins of left lower extremities with pain: Secondary | ICD-10-CM | POA: Diagnosis not present

## 2022-09-09 DIAGNOSIS — M79605 Pain in left leg: Secondary | ICD-10-CM | POA: Diagnosis not present

## 2022-09-09 DIAGNOSIS — M7989 Other specified soft tissue disorders: Secondary | ICD-10-CM | POA: Diagnosis not present

## 2022-09-09 DIAGNOSIS — R600 Localized edema: Secondary | ICD-10-CM | POA: Diagnosis not present

## 2022-09-09 DIAGNOSIS — M79604 Pain in right leg: Secondary | ICD-10-CM | POA: Diagnosis not present

## 2022-09-30 DIAGNOSIS — J029 Acute pharyngitis, unspecified: Secondary | ICD-10-CM | POA: Diagnosis not present

## 2022-09-30 DIAGNOSIS — Z20822 Contact with and (suspected) exposure to covid-19: Secondary | ICD-10-CM | POA: Diagnosis not present

## 2022-09-30 DIAGNOSIS — R051 Acute cough: Secondary | ICD-10-CM | POA: Diagnosis not present

## 2022-09-30 DIAGNOSIS — B974 Respiratory syncytial virus as the cause of diseases classified elsewhere: Secondary | ICD-10-CM | POA: Diagnosis not present

## 2022-09-30 DIAGNOSIS — H6691 Otitis media, unspecified, right ear: Secondary | ICD-10-CM | POA: Diagnosis not present

## 2022-10-26 ENCOUNTER — Ambulatory Visit
Admission: RE | Admit: 2022-10-26 | Discharge: 2022-10-26 | Disposition: A | Discharge status: Home / Self Care | Payer: Medicare Other | Source: Ambulatory Visit | Attending: Family Medicine | Admitting: Family Medicine

## 2022-10-26 DIAGNOSIS — Z1231 Encounter for screening mammogram for malignant neoplasm of breast: Secondary | ICD-10-CM

## 2022-12-30 DIAGNOSIS — N390 Urinary tract infection, site not specified: Secondary | ICD-10-CM | POA: Diagnosis not present

## 2022-12-30 DIAGNOSIS — R300 Dysuria: Secondary | ICD-10-CM | POA: Diagnosis not present

## 2022-12-30 DIAGNOSIS — Z6828 Body mass index (BMI) 28.0-28.9, adult: Secondary | ICD-10-CM | POA: Diagnosis not present

## 2023-02-28 DIAGNOSIS — H60391 Other infective otitis externa, right ear: Secondary | ICD-10-CM | POA: Diagnosis not present

## 2023-03-03 DIAGNOSIS — L03222 Acute lymphangitis of neck: Secondary | ICD-10-CM | POA: Diagnosis not present

## 2023-03-17 DIAGNOSIS — H9201 Otalgia, right ear: Secondary | ICD-10-CM | POA: Diagnosis not present

## 2023-03-20 DIAGNOSIS — T63441A Toxic effect of venom of bees, accidental (unintentional), initial encounter: Secondary | ICD-10-CM | POA: Diagnosis not present

## 2023-03-20 DIAGNOSIS — J309 Allergic rhinitis, unspecified: Secondary | ICD-10-CM | POA: Diagnosis not present

## 2023-03-20 DIAGNOSIS — T7840XA Allergy, unspecified, initial encounter: Secondary | ICD-10-CM | POA: Diagnosis not present

## 2023-03-31 DIAGNOSIS — I83812 Varicose veins of left lower extremities with pain: Secondary | ICD-10-CM | POA: Diagnosis not present

## 2023-03-31 DIAGNOSIS — R600 Localized edema: Secondary | ICD-10-CM | POA: Diagnosis not present

## 2023-03-31 DIAGNOSIS — I872 Venous insufficiency (chronic) (peripheral): Secondary | ICD-10-CM | POA: Diagnosis not present

## 2023-05-19 DIAGNOSIS — J029 Acute pharyngitis, unspecified: Secondary | ICD-10-CM | POA: Diagnosis not present

## 2023-05-19 DIAGNOSIS — H109 Unspecified conjunctivitis: Secondary | ICD-10-CM | POA: Diagnosis not present

## 2023-05-19 DIAGNOSIS — R059 Cough, unspecified: Secondary | ICD-10-CM | POA: Diagnosis not present

## 2023-05-19 DIAGNOSIS — J069 Acute upper respiratory infection, unspecified: Secondary | ICD-10-CM | POA: Diagnosis not present

## 2023-05-19 DIAGNOSIS — Z20822 Contact with and (suspected) exposure to covid-19: Secondary | ICD-10-CM | POA: Diagnosis not present

## 2023-05-21 DIAGNOSIS — Z20822 Contact with and (suspected) exposure to covid-19: Secondary | ICD-10-CM | POA: Diagnosis not present

## 2023-05-21 DIAGNOSIS — J019 Acute sinusitis, unspecified: Secondary | ICD-10-CM | POA: Diagnosis not present

## 2023-05-21 DIAGNOSIS — J040 Acute laryngitis: Secondary | ICD-10-CM | POA: Diagnosis not present

## 2023-07-01 ENCOUNTER — Encounter: Payer: Self-pay | Admitting: Otolaryngology

## 2023-07-01 ENCOUNTER — Other Ambulatory Visit: Payer: Self-pay | Admitting: Otolaryngology

## 2023-07-01 DIAGNOSIS — J029 Acute pharyngitis, unspecified: Secondary | ICD-10-CM | POA: Diagnosis not present

## 2023-07-01 DIAGNOSIS — H9201 Otalgia, right ear: Secondary | ICD-10-CM | POA: Diagnosis not present

## 2023-07-01 DIAGNOSIS — R1314 Dysphagia, pharyngoesophageal phase: Secondary | ICD-10-CM | POA: Diagnosis not present

## 2023-07-06 DIAGNOSIS — H43813 Vitreous degeneration, bilateral: Secondary | ICD-10-CM | POA: Diagnosis not present

## 2023-07-06 DIAGNOSIS — H04123 Dry eye syndrome of bilateral lacrimal glands: Secondary | ICD-10-CM | POA: Diagnosis not present

## 2023-07-06 DIAGNOSIS — Z961 Presence of intraocular lens: Secondary | ICD-10-CM | POA: Diagnosis not present

## 2023-07-15 ENCOUNTER — Ambulatory Visit
Admission: RE | Admit: 2023-07-15 | Discharge: 2023-07-15 | Disposition: A | Discharge status: Home / Self Care | Payer: Medicare Other | Source: Ambulatory Visit | Attending: Otolaryngology | Admitting: Otolaryngology

## 2023-07-15 DIAGNOSIS — R1314 Dysphagia, pharyngoesophageal phase: Secondary | ICD-10-CM | POA: Diagnosis not present

## 2023-07-15 DIAGNOSIS — J029 Acute pharyngitis, unspecified: Secondary | ICD-10-CM

## 2023-07-15 DIAGNOSIS — H9201 Otalgia, right ear: Secondary | ICD-10-CM

## 2023-07-15 DIAGNOSIS — K219 Gastro-esophageal reflux disease without esophagitis: Secondary | ICD-10-CM | POA: Diagnosis not present

## 2023-07-15 DIAGNOSIS — K449 Diaphragmatic hernia without obstruction or gangrene: Secondary | ICD-10-CM | POA: Diagnosis not present

## 2023-08-17 DIAGNOSIS — M7701 Medial epicondylitis, right elbow: Secondary | ICD-10-CM | POA: Diagnosis not present

## 2023-10-05 DIAGNOSIS — I872 Venous insufficiency (chronic) (peripheral): Secondary | ICD-10-CM | POA: Diagnosis not present

## 2023-10-05 DIAGNOSIS — G43909 Migraine, unspecified, not intractable, without status migrainosus: Secondary | ICD-10-CM | POA: Diagnosis not present

## 2023-10-05 DIAGNOSIS — E039 Hypothyroidism, unspecified: Secondary | ICD-10-CM | POA: Diagnosis not present

## 2023-10-05 DIAGNOSIS — G250 Essential tremor: Secondary | ICD-10-CM | POA: Diagnosis not present

## 2023-10-05 DIAGNOSIS — Z6828 Body mass index (BMI) 28.0-28.9, adult: Secondary | ICD-10-CM | POA: Diagnosis not present

## 2023-10-05 DIAGNOSIS — H538 Other visual disturbances: Secondary | ICD-10-CM | POA: Diagnosis not present

## 2023-11-11 DIAGNOSIS — E039 Hypothyroidism, unspecified: Secondary | ICD-10-CM | POA: Diagnosis not present

## 2023-11-12 ENCOUNTER — Other Ambulatory Visit: Payer: Self-pay | Admitting: Family Medicine

## 2023-11-12 DIAGNOSIS — Z0000 Encounter for general adult medical examination without abnormal findings: Secondary | ICD-10-CM

## 2023-11-18 ENCOUNTER — Ambulatory Visit
Admission: RE | Admit: 2023-11-18 | Discharge: 2023-11-18 | Disposition: A | Discharge status: Home / Self Care | Payer: Medicare Other | Source: Ambulatory Visit | Attending: Family Medicine | Admitting: Family Medicine

## 2023-11-18 DIAGNOSIS — Z1231 Encounter for screening mammogram for malignant neoplasm of breast: Secondary | ICD-10-CM | POA: Diagnosis not present

## 2023-11-18 DIAGNOSIS — Z0000 Encounter for general adult medical examination without abnormal findings: Secondary | ICD-10-CM

## 2023-11-19 ENCOUNTER — Ambulatory Visit: Payer: Medicare Other

## 2023-12-22 DIAGNOSIS — G250 Essential tremor: Secondary | ICD-10-CM | POA: Diagnosis not present

## 2023-12-22 DIAGNOSIS — Z0000 Encounter for general adult medical examination without abnormal findings: Secondary | ICD-10-CM | POA: Diagnosis not present

## 2023-12-22 DIAGNOSIS — M810 Age-related osteoporosis without current pathological fracture: Secondary | ICD-10-CM | POA: Diagnosis not present

## 2023-12-22 DIAGNOSIS — E7800 Pure hypercholesterolemia, unspecified: Secondary | ICD-10-CM | POA: Diagnosis not present

## 2023-12-22 DIAGNOSIS — Z6828 Body mass index (BMI) 28.0-28.9, adult: Secondary | ICD-10-CM | POA: Diagnosis not present

## 2023-12-22 DIAGNOSIS — E039 Hypothyroidism, unspecified: Secondary | ICD-10-CM | POA: Diagnosis not present

## 2023-12-23 ENCOUNTER — Other Ambulatory Visit: Payer: Self-pay | Admitting: Family Medicine

## 2023-12-23 DIAGNOSIS — M810 Age-related osteoporosis without current pathological fracture: Secondary | ICD-10-CM

## 2023-12-29 DIAGNOSIS — E039 Hypothyroidism, unspecified: Secondary | ICD-10-CM | POA: Diagnosis not present

## 2023-12-29 DIAGNOSIS — E7800 Pure hypercholesterolemia, unspecified: Secondary | ICD-10-CM | POA: Diagnosis not present

## 2024-01-14 DIAGNOSIS — E7800 Pure hypercholesterolemia, unspecified: Secondary | ICD-10-CM | POA: Diagnosis not present

## 2024-02-02 DIAGNOSIS — E039 Hypothyroidism, unspecified: Secondary | ICD-10-CM | POA: Diagnosis not present

## 2024-02-09 DIAGNOSIS — Z6827 Body mass index (BMI) 27.0-27.9, adult: Secondary | ICD-10-CM | POA: Diagnosis not present

## 2024-02-09 DIAGNOSIS — G250 Essential tremor: Secondary | ICD-10-CM | POA: Diagnosis not present

## 2024-02-09 DIAGNOSIS — E039 Hypothyroidism, unspecified: Secondary | ICD-10-CM | POA: Diagnosis not present

## 2024-02-09 DIAGNOSIS — E7800 Pure hypercholesterolemia, unspecified: Secondary | ICD-10-CM | POA: Diagnosis not present

## 2024-03-04 DIAGNOSIS — E039 Hypothyroidism, unspecified: Secondary | ICD-10-CM | POA: Diagnosis not present

## 2024-05-04 DIAGNOSIS — E039 Hypothyroidism, unspecified: Secondary | ICD-10-CM | POA: Diagnosis not present

## 2024-05-17 ENCOUNTER — Encounter (HOSPITAL_BASED_OUTPATIENT_CLINIC_OR_DEPARTMENT_OTHER): Payer: Self-pay

## 2024-05-17 ENCOUNTER — Other Ambulatory Visit (HOSPITAL_BASED_OUTPATIENT_CLINIC_OR_DEPARTMENT_OTHER)

## 2024-06-01 DIAGNOSIS — R059 Cough, unspecified: Secondary | ICD-10-CM | POA: Diagnosis not present

## 2024-06-01 DIAGNOSIS — J029 Acute pharyngitis, unspecified: Secondary | ICD-10-CM | POA: Diagnosis not present

## 2024-06-01 DIAGNOSIS — Z20822 Contact with and (suspected) exposure to covid-19: Secondary | ICD-10-CM | POA: Diagnosis not present

## 2024-06-03 DIAGNOSIS — J069 Acute upper respiratory infection, unspecified: Secondary | ICD-10-CM | POA: Diagnosis not present

## 2024-06-10 DIAGNOSIS — J069 Acute upper respiratory infection, unspecified: Secondary | ICD-10-CM | POA: Diagnosis not present

## 2024-06-10 DIAGNOSIS — J029 Acute pharyngitis, unspecified: Secondary | ICD-10-CM | POA: Diagnosis not present

## 2024-07-04 DIAGNOSIS — E039 Hypothyroidism, unspecified: Secondary | ICD-10-CM | POA: Diagnosis not present

## 2024-07-31 DIAGNOSIS — I872 Venous insufficiency (chronic) (peripheral): Secondary | ICD-10-CM | POA: Diagnosis not present

## 2024-07-31 DIAGNOSIS — I87392 Chronic venous hypertension (idiopathic) with other complications of left lower extremity: Secondary | ICD-10-CM | POA: Diagnosis not present

## 2024-07-31 DIAGNOSIS — R600 Localized edema: Secondary | ICD-10-CM | POA: Diagnosis not present

## 2024-08-04 DIAGNOSIS — H02832 Dermatochalasis of right lower eyelid: Secondary | ICD-10-CM | POA: Diagnosis not present

## 2024-08-04 DIAGNOSIS — Z961 Presence of intraocular lens: Secondary | ICD-10-CM | POA: Diagnosis not present

## 2024-08-04 DIAGNOSIS — H43813 Vitreous degeneration, bilateral: Secondary | ICD-10-CM | POA: Diagnosis not present

## 2024-08-04 DIAGNOSIS — H26492 Other secondary cataract, left eye: Secondary | ICD-10-CM | POA: Diagnosis not present

## 2024-08-04 DIAGNOSIS — H04123 Dry eye syndrome of bilateral lacrimal glands: Secondary | ICD-10-CM | POA: Diagnosis not present

## 2024-08-04 DIAGNOSIS — E039 Hypothyroidism, unspecified: Secondary | ICD-10-CM | POA: Diagnosis not present

## 2024-09-03 DIAGNOSIS — E039 Hypothyroidism, unspecified: Secondary | ICD-10-CM | POA: Diagnosis not present

## 2024-09-04 ENCOUNTER — Other Ambulatory Visit

## 2024-09-15 DIAGNOSIS — M25551 Pain in right hip: Secondary | ICD-10-CM | POA: Diagnosis not present

## 2024-09-15 DIAGNOSIS — M67951 Unspecified disorder of synovium and tendon, right thigh: Secondary | ICD-10-CM | POA: Diagnosis not present

## 2024-11-03 ENCOUNTER — Other Ambulatory Visit (HOSPITAL_COMMUNITY): Payer: Self-pay

## 2024-11-03 DIAGNOSIS — M25551 Pain in right hip: Secondary | ICD-10-CM

## 2024-11-03 DIAGNOSIS — M67951 Unspecified disorder of synovium and tendon, right thigh: Secondary | ICD-10-CM

## 2024-11-06 ENCOUNTER — Other Ambulatory Visit: Payer: Self-pay

## 2024-11-06 DIAGNOSIS — M67951 Unspecified disorder of synovium and tendon, right thigh: Secondary | ICD-10-CM

## 2024-11-06 DIAGNOSIS — M25551 Pain in right hip: Secondary | ICD-10-CM
